# Patient Record
Sex: Female | Born: 1940 | Race: White | Hispanic: No | Marital: Married | Smoking: Never smoker
Health system: Southern US, Community
[De-identification: ages and names within clinical notes are randomized; demographics above are authoritative.]

## PROBLEM LIST (undated history)

## (undated) DIAGNOSIS — E039 Hypothyroidism, unspecified: Secondary | ICD-10-CM

## (undated) DIAGNOSIS — M1990 Unspecified osteoarthritis, unspecified site: Secondary | ICD-10-CM

## (undated) DIAGNOSIS — I4891 Unspecified atrial fibrillation: Secondary | ICD-10-CM

## (undated) HISTORY — PX: PANCREAS SURGERY: SHX731

## (undated) HISTORY — PX: ABDOMINAL HYSTERECTOMY: SHX81

## (undated) HISTORY — PX: CHOLECYSTECTOMY: SHX55

## (undated) HISTORY — DX: Unspecified osteoarthritis, unspecified site: M19.90

## (undated) HISTORY — DX: Hypothyroidism, unspecified: E03.9

## (undated) HISTORY — PX: APPENDECTOMY: SHX54

---

## 2015-09-28 ENCOUNTER — Emergency Department
Admission: EM | Admit: 2015-09-28 | Discharge: 2015-09-28 | Disposition: A | Discharge status: Home / Self Care | Payer: Medicare Other | Source: Home / Self Care

## 2015-09-28 DIAGNOSIS — Z111 Encounter for screening for respiratory tuberculosis: Secondary | ICD-10-CM | POA: Diagnosis not present

## 2015-09-28 MED ORDER — TUBERCULIN PPD 5 UNIT/0.1ML ID SOLN
5.0000 [IU] | Freq: Once | INTRADERMAL | Status: DC
  Administered 2015-09-28: 5 [IU] via INTRADERMAL

## 2015-09-28 NOTE — ED Notes (Signed)
PPD placement

## 2015-09-30 ENCOUNTER — Emergency Department
Admission: EM | Admit: 2015-09-30 | Discharge: 2015-09-30 | Disposition: A | Discharge status: Home / Self Care | Payer: Self-pay | Source: Home / Self Care

## 2015-10-21 ENCOUNTER — Encounter: Payer: Self-pay | Admitting: Osteopathic Medicine

## 2015-10-21 ENCOUNTER — Ambulatory Visit (INDEPENDENT_AMBULATORY_CARE_PROVIDER_SITE_OTHER): Payer: Medicare Other | Admitting: Osteopathic Medicine

## 2015-10-21 VITALS — BP 146/79 | HR 76 | Ht 63.0 in | Wt 113.0 lb

## 2015-10-21 DIAGNOSIS — M1990 Unspecified osteoarthritis, unspecified site: Secondary | ICD-10-CM

## 2015-10-21 DIAGNOSIS — Z79899 Other long term (current) drug therapy: Secondary | ICD-10-CM | POA: Diagnosis not present

## 2015-10-21 DIAGNOSIS — I4891 Unspecified atrial fibrillation: Secondary | ICD-10-CM

## 2015-10-21 DIAGNOSIS — E039 Hypothyroidism, unspecified: Secondary | ICD-10-CM

## 2015-10-21 DIAGNOSIS — Z139 Encounter for screening, unspecified: Secondary | ICD-10-CM

## 2015-10-21 DIAGNOSIS — Z13220 Encounter for screening for lipoid disorders: Secondary | ICD-10-CM

## 2015-10-21 DIAGNOSIS — Z780 Asymptomatic menopausal state: Secondary | ICD-10-CM

## 2015-10-21 NOTE — Progress Notes (Signed)
HPI: Sheral FlowDiane Lazare is a 74 y.o. female who [facility] for chief complaint of:  Chief Complaint  Patient presents with  . Establish Care    Last January had Thyroid checked.  Needs CBC, CMP and UA prior to moving into [place]. No additional complaints today   Past medical, social and family history reviewed: Past Medical History  Diagnosis Date  . Hypothyroidism 10/22/2015  . Osteoarthritis 10/22/2015   Past Surgical History  Procedure Laterality Date  . Cholecystectomy    . Appendectomy    . Abdominal hysterectomy    . Pancreas surgery     Social History  Substance Use Topics  . Smoking status: Never Smoker   . Smokeless tobacco: Not on file  . Alcohol Use: Not on file   No family history on file.  Current Outpatient Prescriptions  Medication Sig Dispense Refill  . celecoxib (CELEBREX) 200 MG capsule Take 200 mg by mouth 2 (two) times daily.    . flecainide (TAMBOCOR) 100 MG tablet Take 100 mg by mouth 2 (two) times daily.    Marland Kitchen. levothyroxine (SYNTHROID, LEVOTHROID) 125 MCG tablet Take 125 mcg by mouth daily before breakfast.     No current facility-administered medications for this visit.   No Known Allergies    Review of Systems: CONSTITUTIONAL:  No  fever, no chills, No  unintentional weight changes HEAD/EYES/EARS/NOSE/THROAT: No  headache, no vision change, no hearing change, No  sore throat, No  sinus pressure CARDIAC: No  chest pain, No  pressure, No palpitations, No  orthopnea RESPIRATORY: No  cough, No  shortness of breath/wheeze GASTROINTESTINAL: No  nausea, No  vomiting, No  abdominal pain, No  blood in stool, No  diarrhea, No  constipation  MUSCULOSKELETAL: No  myalgia/arthralgia GENITOURINARY: No  incontinence, No  abnormal genital bleeding/discharge SKIN: No  rash/wounds/concerning lesions HEM/ONC: No  easy bruising/bleeding, No  abnormal lymph node ENDOCRINE: No   polyuria/polydipsia/polyphagia, No  heat/cold intolerance  NEUROLOGIC: No  weakness, No  dizziness, No  slurred speech PSYCHIATRIC: No  concerns with depression, No  concerns with anxiety, No sleep problems     Exam:  BP 146/79 mmHg  Pulse 76  Ht 5\' 3"  (1.6 m)  Wt 113 lb (51.256 kg)  BMI 20.02 kg/m2 Constitutional: VS see above. General Appearance: alert, well-developed, well-nourished, NAD Eyes: Normal lids and conjunctive, non-icteric sclera, PERRLA Ears, Nose, Mouth, Throat: MMM, Normal external inspection ears/nares/mouth/lips/gums, TM normal, posterior pharynx No  erythema No  exudate Neck: No masses, trachea midline. No thyroid enlargement/tenderness/mass appreciated. No lymphadenopathy Respiratory: Normal respiratory effort. no wheeze, no rhonchi, no rales Cardiovascular: S1/S2 normal, no murmur, no rub/gallop auscultated. RRR. No lower extremity edema. Gastrointestinal: Nontender, no masses.  Musculoskeletal: Gait normal. No clubbing/cyanosis of digits.  Neurological: No cranial nerve deficit on limited exam. Motor and sensation intact and symmetric Skin: warm, dry, intact. No rash/ulcer. No concerning nevi or subq nodules on limited exam.   Psychiatric: Normal judgment/insight. Normal mood and affect. Oriented x3.    No results found for this or any previous visit (from the past 72 hour(s)).    ASSESSMENT/PLAN:  Screening - Plan: CBC with Differential/Platelet, COMPLETE METABOLIC PANEL WITH GFR, Urinalysis  Hypothyroidism, unspecified hypothyroidism type - Plan: TSH  Lipid screening - Plan: Lipid panel  Medication management - Plan: CBC with Differential/Platelet, COMPLETE METABOLIC PANEL WITH GFR, Lipid panel, Urinalysis  Atrial fibrillation, unspecified type (HCC) - Plan: CBC with Differential/Platelet, COMPLETE METABOLIC PANEL WITH  GFR, Lipid panel, Urinalysis. Patient reports history of atrial fibrillation, rate is regular on physical exam today, patient will  consider if she wants to reestablish with cardiology locally after her move here, we'll request records  Postmenopausal - Plan: Lipid panel  Osteoarthritis, unspecified osteoarthritis type, unspecified site   Patient reports up-to-date on all age appropriate vaccines, do not have records available. We'll need to get these.    FEMALE PREVENTIVE CARE  ANNUAL SCREENING/COUNSELING Tobacco - Never some exposure to secondhand smoke  Alcohol - glass of wine with dinner Diet/Exercise - HEALTHY HABITS DISCUSSED TO DECREASE CV RISK - walks a lot Sexual Health - Yes with female. STI - The patient denies history of sexually transmitted disease. INTERESTED IN STI TESTING - no Depression - PQH2 Negative Domestic violence concerns - no HTN SCREENING - SEE VITALS Vaccination status - SEE BELOW  INFECTIOUS DISEASE SCREENING HIV - all adults 15-65 - does not need GC/CT - sexually active - does not need HepC - born 54-1963 - does not need TB - if risk/required by employer - does not need  DISEASE SCREENING Lipid - (Low risk screen M35/F45; High risk screen M25/F35 if HTN, Tob, FH CHD M<55/F<65) - needs DM2 (45+ or Risk = FH 1st deg DM, Hx GDM, overweight/sedentary, high-risk ethnicity, HTN) - needs Osteoporosis - age 15+ or one sooner if risk - does not need  CANCER SCREENING Cervical - Pap q3 yr age 25+, Pap + HPV q5y age 55+ - PAP - does not need - hysterectomy due to heavy bleeding  Breast - Mammo age 8+ (C) and biennial age 9-75 (A) - MAMMO - does not need has had one earlier this year Lung - annual low dose CT Chest age 45-75 w/ 30+ PY, current/quit past 15 years - CT - does not need a history of smoking Colon - age 48+ or 72 years of age prior to FH Dx - GI REFERRAL - does not need - following with GI, hx polyp   ADULT VACCINATION Influenza - annual - already has  Td booster every 10 years - was not indicated since he already has this HPV - age <91yo - was not indicated Zoster - age  45+ - already has Pneumonia - age 15+ sooner if risk (DM, smoker, other) - already has  OTHER Fall - exercise and Vit D age 15+ - does not need Consider ASA - age 56-59 - does not need     No Follow-up on file.

## 2015-10-22 ENCOUNTER — Encounter: Payer: Self-pay | Admitting: Osteopathic Medicine

## 2015-10-22 DIAGNOSIS — E039 Hypothyroidism, unspecified: Secondary | ICD-10-CM

## 2015-10-22 DIAGNOSIS — M1990 Unspecified osteoarthritis, unspecified site: Secondary | ICD-10-CM

## 2015-10-22 HISTORY — DX: Hypothyroidism, unspecified: E03.9

## 2015-10-22 HISTORY — DX: Unspecified osteoarthritis, unspecified site: M19.90

## 2015-10-22 LAB — COMPLETE METABOLIC PANEL WITH GFR
ALBUMIN: 4.2 g/dL (ref 3.6–5.1)
ALK PHOS: 75 U/L (ref 33–130)
ALT: 10 U/L (ref 6–29)
AST: 14 U/L (ref 10–35)
BILIRUBIN TOTAL: 0.5 mg/dL (ref 0.2–1.2)
BUN: 12 mg/dL (ref 7–25)
CO2: 27 mmol/L (ref 20–31)
Calcium: 9.1 mg/dL (ref 8.6–10.4)
Chloride: 104 mmol/L (ref 98–110)
Creat: 0.7 mg/dL (ref 0.60–0.93)
GFR, EST NON AFRICAN AMERICAN: 87 mL/min (ref >=60)
GLUCOSE: 84 mg/dL (ref 65–99)
POTASSIUM: 4.1 mmol/L (ref 3.5–5.3)
SODIUM: 143 mmol/L (ref 135–146)
TOTAL PROTEIN: 6.6 g/dL (ref 6.1–8.1)

## 2015-10-22 LAB — CBC WITH DIFFERENTIAL/PLATELET
BASOS PCT: 0 % (ref 0–1)
Basophils Absolute: 0 10*3/uL (ref 0.0–0.1)
EOS ABS: 0.1 10*3/uL (ref 0.0–0.7)
Eosinophils Relative: 1 % (ref 0–5)
HCT: 35.3 % — ABNORMAL LOW (ref 36.0–46.0)
Hemoglobin: 12 g/dL (ref 12.0–15.0)
LYMPHS ABS: 3.4 10*3/uL (ref 0.7–4.0)
Lymphocytes Relative: 34 % (ref 12–46)
MCH: 30.6 pg (ref 26.0–34.0)
MCHC: 34 g/dL (ref 30.0–36.0)
MCV: 90.1 fL (ref 78.0–100.0)
MONO ABS: 0.7 10*3/uL (ref 0.1–1.0)
MONOS PCT: 7 % (ref 3–12)
MPV: 10.2 fL (ref 8.6–12.4)
Neutro Abs: 5.8 10*3/uL (ref 1.7–7.7)
Neutrophils Relative %: 58 % (ref 43–77)
PLATELETS: 298 10*3/uL (ref 150–400)
RBC: 3.92 MIL/uL (ref 3.87–5.11)
RDW: 13.9 % (ref 11.5–15.5)
WBC: 10 10*3/uL (ref 4.0–10.5)

## 2015-10-22 LAB — URINALYSIS
BILIRUBIN URINE: NEGATIVE
GLUCOSE, UA: NEGATIVE
Ketones, ur: NEGATIVE
Nitrite: NEGATIVE
PH: 5.5 (ref 5.0–8.0)
Protein, ur: NEGATIVE
Specific Gravity, Urine: 1.009 (ref 1.001–1.035)

## 2015-10-22 LAB — LIPID PANEL
CHOL/HDL RATIO: 1.6 ratio (ref <=5.0)
Cholesterol: 165 mg/dL (ref 125–200)
HDL: 103 mg/dL (ref >=46)
LDL Cholesterol: 49 mg/dL (ref <130)
TRIGLYCERIDES: 63 mg/dL (ref <150)
VLDL: 13 mg/dL (ref <30)

## 2015-10-22 LAB — TSH: TSH: 0.311 u[IU]/mL — AB (ref 0.350–4.500)

## 2015-10-22 MED ORDER — LEVOTHYROXINE SODIUM 100 MCG PO TABS: 100.0000 ug | ORAL_TABLET | Freq: Every day | ORAL | Status: DC

## 2015-11-13 ENCOUNTER — Telehealth: Payer: Self-pay

## 2015-11-13 DIAGNOSIS — E039 Hypothyroidism, unspecified: Secondary | ICD-10-CM

## 2015-11-13 MED ORDER — LEVOTHYROXINE SODIUM 100 MCG PO TABS: 100.0000 ug | ORAL_TABLET | Freq: Every day | ORAL | Status: DC

## 2015-11-13 NOTE — Telephone Encounter (Signed)
PATIENT REQUEST 90 DAY OF SYNTHROID. Marie Maldonado,CMA

## 2016-02-21 ENCOUNTER — Other Ambulatory Visit: Payer: Self-pay | Admitting: Osteopathic Medicine

## 2016-06-28 ENCOUNTER — Emergency Department (HOSPITAL_BASED_OUTPATIENT_CLINIC_OR_DEPARTMENT_OTHER)
Admission: EM | Admit: 2016-06-28 | Discharge: 2016-06-28 | Disposition: A | Discharge status: Home / Self Care | Payer: Medicare Other | Attending: Emergency Medicine | Admitting: Emergency Medicine

## 2016-06-28 ENCOUNTER — Emergency Department (HOSPITAL_BASED_OUTPATIENT_CLINIC_OR_DEPARTMENT_OTHER): Payer: Medicare Other

## 2016-06-28 ENCOUNTER — Emergency Department (HOSPITAL_COMMUNITY): Payer: Medicare Other

## 2016-06-28 ENCOUNTER — Encounter (HOSPITAL_BASED_OUTPATIENT_CLINIC_OR_DEPARTMENT_OTHER): Payer: Self-pay | Admitting: *Deleted

## 2016-06-28 DIAGNOSIS — M4726 Other spondylosis with radiculopathy, lumbar region: Secondary | ICD-10-CM | POA: Insufficient documentation

## 2016-06-28 DIAGNOSIS — M419 Scoliosis, unspecified: Secondary | ICD-10-CM | POA: Diagnosis not present

## 2016-06-28 DIAGNOSIS — E039 Hypothyroidism, unspecified: Secondary | ICD-10-CM | POA: Diagnosis not present

## 2016-06-28 DIAGNOSIS — M6281 Muscle weakness (generalized): Secondary | ICD-10-CM

## 2016-06-28 DIAGNOSIS — R531 Weakness: Secondary | ICD-10-CM

## 2016-06-28 DIAGNOSIS — M545 Low back pain: Secondary | ICD-10-CM

## 2016-06-28 DIAGNOSIS — M5116 Intervertebral disc disorders with radiculopathy, lumbar region: Secondary | ICD-10-CM | POA: Insufficient documentation

## 2016-06-28 DIAGNOSIS — Z79899 Other long term (current) drug therapy: Secondary | ICD-10-CM | POA: Insufficient documentation

## 2016-06-28 DIAGNOSIS — M4806 Spinal stenosis, lumbar region: Secondary | ICD-10-CM | POA: Diagnosis not present

## 2016-06-28 DIAGNOSIS — M5416 Radiculopathy, lumbar region: Secondary | ICD-10-CM

## 2016-06-28 DIAGNOSIS — M5136 Other intervertebral disc degeneration, lumbar region: Secondary | ICD-10-CM | POA: Diagnosis not present

## 2016-06-28 HISTORY — DX: Unspecified atrial fibrillation: I48.91

## 2016-06-28 MED ORDER — METHYLPREDNISOLONE 4 MG PO TBPK: ORAL_TABLET | ORAL | 0 refills | Status: DC

## 2016-06-28 MED ORDER — CYCLOBENZAPRINE HCL 10 MG PO TABS
10.0000 mg | ORAL_TABLET | Freq: Once | ORAL | Status: AC
  Administered 2016-06-28: 10 mg via ORAL
  Filled 2016-06-28: qty 1

## 2016-06-28 MED ORDER — NAPROXEN 500 MG PO TABS: 500.0000 mg | ORAL_TABLET | Freq: Two times a day (BID) | ORAL | 0 refills | Status: AC

## 2016-06-28 MED ORDER — KETOROLAC TROMETHAMINE 60 MG/2ML IM SOLN
30.0000 mg | Freq: Once | INTRAMUSCULAR | Status: AC
  Administered 2016-06-28: 30 mg via INTRAMUSCULAR
  Filled 2016-06-28: qty 2

## 2016-06-28 NOTE — ED Triage Notes (Signed)
C/o low back pain yesterday and slept on cough last pm and today back was hurting more. Now has back pain and hip pain. Arrived via [facility].

## 2016-06-28 NOTE — ED Notes (Signed)
Pt taken to xray prior to pain meds. Pt refused to move for xrays to be taken. Pt brought back to department and pain meds given will try xray again later.

## 2016-06-28 NOTE — ED Notes (Signed)
Due to pt's continued sx and inability to ambulate, will be [facility].

## 2016-06-28 NOTE — ED Notes (Signed)
Dr Clydene PughKnott on phone advising pt's daughter of dx and tx plan.

## 2016-06-28 NOTE — ED Notes (Signed)
Dr Clydene PughKnott aware pt has returned from MRI and results available.

## 2016-06-28 NOTE — ED Notes (Signed)
Pt returned from MRI. Lying on bed w/eyes closed. Respirations even, unlabored. Easily awakens to voice.

## 2016-06-28 NOTE — ED Notes (Addendum)
Dr Clydene PughKnott aware pt ambulated in hallway w/o difficulty. Pt states she feels stable to go home. Pt given Sprite - declined food.

## 2016-06-28 NOTE — ED Provider Notes (Signed)
[facility] Provider Note   CSN: [id number] Arrival date & time: 06/28/16  4098     History   Chief Complaint Chief Complaint  Patient presents with  . Back Pain    HPI Marie Maldonado is a 75 y.o. female.  The history is provided by the patient.  Back Pain   This is a recurrent problem. The current episode started 2 days ago. The problem occurs constantly. The problem has been rapidly worsening. The pain is associated with no known injury (states she was going through boxes yesterday and is very active but does not know of anything specific). The pain is present in the lumbar spine. The quality of the pain is described as aching, stabbing and shooting. The pain radiates to the left thigh and right thigh. The pain is at a severity of 10/10. The pain is severe. The symptoms are aggravated by bending, twisting and certain positions. The pain is the same all the time. Stiffness is present in the morning. Pertinent negatives include no chest pain, no fever, no numbness, no abdominal pain, no abdominal swelling, no bowel incontinence, no perianal numbness, no bladder incontinence, no dysuria, no pelvic pain, no leg pain, no paresthesias, no paresis and no weakness. She has tried nothing for the symptoms. The treatment provided no relief. Risk factors: states she had this one other time about 10 years ago but not this extreme.    Past Medical History:  Diagnosis Date  . Atrial fibrillation (HCC)   . Hypothyroidism 10/22/2015  . Osteoarthritis 10/22/2015    Patient Active Problem List   Diagnosis Date Noted  . Hypothyroidism 10/22/2015  . Osteoarthritis 10/22/2015    Past Surgical History:  Procedure Laterality Date  . ABDOMINAL HYSTERECTOMY    . APPENDECTOMY    . CHOLECYSTECTOMY    . PANCREAS SURGERY      OB History    No data available       Home Medications    Prior to Admission medications   Medication Sig Start Date End Date Taking? Authorizing Provider    aspirin EC 81 MG tablet Take 81 mg by mouth daily.   Yes Historical Provider, MD  celecoxib (CELEBREX) 200 MG capsule Take 200 mg by mouth 2 (two) times daily.   Yes Historical Provider, MD  diltiazem (CARDIZEM) 30 MG tablet Take 30 mg by mouth 4 (four) times daily.   Yes Historical Provider, MD  flecainide (TAMBOCOR) 100 MG tablet Take 100 mg by mouth 2 (two) times daily.   Yes Historical Provider, MD  fluticasone (FLONASE) 50 MCG/ACT nasal spray Place into both nostrils daily.   Yes Historical Provider, MD  levothyroxine (SYNTHROID, LEVOTHROID) 100 MCG tablet Take 1 tablet (100 mcg total) by mouth daily before breakfast. LABS NEEDED FOR FURTHER REFILLS 02/22/16  Yes Sunnie Nielsen, DO    Family History No family history on file.  Social History Social History  Substance Use Topics  . Smoking status: Never Smoker  . Smokeless tobacco: Never Used  . Alcohol use Not on file     Allergies   Review of patient's allergies indicates no known allergies.   Review of Systems Review of Systems  Constitutional: Negative for fever.  Cardiovascular: Negative for chest pain.  Gastrointestinal: Negative for abdominal pain and bowel incontinence.  Genitourinary: Negative for bladder incontinence, dysuria and pelvic pain.  Musculoskeletal: Positive for back pain.  Neurological: Negative for weakness, numbness and paresthesias.  All other systems reviewed and are negative.  Physical Exam Updated Vital Signs BP 138/87 (BP Location: Right Arm)   Pulse 86   Temp 98.8 F (37.1 C) (Oral)   Resp 16   Ht 5\' 3"  (1.6 m)   Wt 110 lb (49.9 kg)   SpO2 99%   BMI 19.49 kg/m   Physical Exam  Constitutional: She is oriented to person, place, and time. She appears well-developed and well-nourished. She appears distressed.  HENT:  Head: Normocephalic and atraumatic.  Mouth/Throat: Oropharynx is clear and moist.  Eyes: EOM are normal. Pupils are equal, round, and reactive to light.  Neck:  Normal range of motion. Neck supple. No spinous process tenderness and no muscular tenderness present. No neck rigidity. Normal range of motion present.  Cardiovascular: Normal rate, regular rhythm, normal heart sounds and intact distal pulses.   No murmur heard. Pulmonary/Chest: Effort normal and breath sounds normal. She has no wheezes. She has no rales.  Abdominal: Soft. She exhibits no distension. There is no tenderness. There is no CVA tenderness.  Musculoskeletal: She exhibits tenderness.       Right shoulder: She exhibits decreased range of motion, bony tenderness, pain and spasm. She exhibits normal pulse.       Lumbar back: She exhibits decreased range of motion and pain. She exhibits no swelling, no deformity and normal pulse.       Arms: Neurological: She is alert and oriented to person, place, and time. No sensory deficit. She exhibits normal muscle tone. Coordination normal.  Reflex Scores:      Patellar reflexes are 1+ on the right side and 1+ on the left side. 5/5 dorsi/plantar flexion of the feet bilaterally.  Pt is able to flex both knee independently but slowly and reproduces pain and stops.  Will not lift leg straight off the bed bilaterally most likely due to pain.  Difficult to assess strength.  Sensation intact over bilateral lower ext.  <3sec cap refill and 2+ DP pulses bilaterally.  Currently unable to ambulate due to pain  Skin: Skin is warm and dry. No rash noted.  Psychiatric: She has a normal mood and affect.  Nursing note and vitals reviewed.    ED Treatments / Results  Labs (all labs ordered are listed, but only abnormal results are displayed) Labs Reviewed - No data to display  EKG  EKG Interpretation None       Radiology Dg Lumbar Spine Complete  Result Date: 06/28/2016 CLINICAL DATA:  Muscle spasms in lower back since yesterday. Pain radiating to right side. EXAM: LUMBAR SPINE - COMPLETE 4+ VIEW COMPARISON:  None. FINDINGS: There is rightward  scoliosis in the mid lumbar spine. 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 No fracture. SI joints are symmetric and unremarkable. IMPRESSION: Scoliosis and advanced degenerative changes.  No acute findings. Electronically Signed   By: Charlett NoseKevin  Dover M.D.   On: 06/28/2016 11:17    Procedures Procedures (including critical care time)  Medications Ordered in ED Medications  ketorolac (TORADOL) injection 30 mg (not administered)  cyclobenzaprine (FLEXERIL) tablet 10 mg (not administered)     Initial Impression / Assessment and Plan / ED Course  I have reviewed the triage vital signs and the nursing notes.  Pertinent labs & imaging results that were available during my care of the patient were reviewed by me and considered in my medical decision making (see chart for details).  Clinical Course   Pt with gradual onset of back pain suggestive of radiculopathy.  No neurovascular compromise  and no incontinence however pt slow to move lower ext most likely related to pain.  Normal 5/5 strength on foot plantar and dorsiflexion.  Pt has no infectious sx, hx of CA  or other red flags concerning for pathologic back pain.  Denies trauma. Will give pt pain control and x-rays pending.  Will give toradol and flexeril and re-evaluate  11:43 AM Patient's pain is significantly improved. X-ray shows scoliosis and degenerative disc disease but no other acute findings such as compression fracture. Findings were discussed with the patient. We'll attempt to ambulate the patient.  2:33 PM After multiple attempts patient was unable to ambulate due to leg weakness. She has bilateral weakness in the quadriceps muscles. She is unable to lift her legs off the bed when she normally can walk 3-4 miles every day. Her pain is controlled at a 1 out of 10 and do not feel that the pain is the cause of her inability to move her legs. Will transfer to [facility] for MRI for further  evaluation.  Final Clinical Impressions(s) / ED Diagnoses   Final diagnoses:  Midline low back pain, with sciatica presence unspecified  Muscle weakness    New Prescriptions New Prescriptions   No medications on file     Gwyneth Sprout, MD 06/28/16 1434

## 2016-06-28 NOTE — ED Provider Notes (Signed)
Patient was seen and evaluated on arrival to the emergency department.  Pending MR for r/o cord involvement with severely limited mobility and back pain.   MR negative for cord pathology with question of nerve root impingement. Pt able to ambulate without pain here. Feels weak from not using legs but ambulating at baseline. Started on short course scheduled NSAIDs and steroid taper for supportive care. Plan to follow up with PCP as needed and return precautions discussed for worsening or new concerning symptoms.    Lyndal Pulleyaniel Demetris Capell, MD 06/28/16 [phone]

## 2016-06-28 NOTE — ED Notes (Signed)
Transported to MRI via stretcher.

## 2016-06-28 NOTE — ED Notes (Signed)
RN assisted pt changing into personal clothing. Pt standing at nurses' desk waiting for w/c so may be escorted to lobby while waiting for ride from [place]. Pt requesting to wait in lobby. Pt has all of her personal belongings - purse, cell phone, etc.

## 2016-06-28 NOTE — ED Notes (Signed)
Pt's daughter, Hanley HaysReghan, called - advised will call back in approx 1 hour for MRI results and tx plan - [phone].

## 2020-11-24 ENCOUNTER — Emergency Department (HOSPITAL_COMMUNITY): Payer: Medicare Other

## 2020-11-24 ENCOUNTER — Inpatient Hospital Stay (HOSPITAL_COMMUNITY)
Admission: EM | Admit: 2020-11-24 | Discharge: 2020-11-26 | DRG: 689 | Disposition: A | Discharge status: Skilled Nursing Facility | Payer: Medicare Other | Source: Skilled Nursing Facility | Attending: Family Medicine | Admitting: Family Medicine

## 2020-11-24 DIAGNOSIS — G250 Essential tremor: Secondary | ICD-10-CM | POA: Diagnosis present

## 2020-11-24 DIAGNOSIS — Z791 Long term (current) use of non-steroidal anti-inflammatories (NSAID): Secondary | ICD-10-CM

## 2020-11-24 DIAGNOSIS — E039 Hypothyroidism, unspecified: Secondary | ICD-10-CM | POA: Diagnosis present

## 2020-11-24 DIAGNOSIS — G9341 Metabolic encephalopathy: Secondary | ICD-10-CM | POA: Diagnosis present

## 2020-11-24 DIAGNOSIS — Z20822 Contact with and (suspected) exposure to covid-19: Secondary | ICD-10-CM | POA: Diagnosis present

## 2020-11-24 DIAGNOSIS — B9620 Unspecified Escherichia coli [E. coli] as the cause of diseases classified elsewhere: Secondary | ICD-10-CM | POA: Diagnosis present

## 2020-11-24 DIAGNOSIS — G9340 Encephalopathy, unspecified: Secondary | ICD-10-CM | POA: Diagnosis present

## 2020-11-24 DIAGNOSIS — E860 Dehydration: Secondary | ICD-10-CM | POA: Diagnosis present

## 2020-11-24 DIAGNOSIS — Z90710 Acquired absence of both cervix and uterus: Secondary | ICD-10-CM | POA: Diagnosis not present

## 2020-11-24 DIAGNOSIS — Z79890 Hormone replacement therapy (postmenopausal): Secondary | ICD-10-CM | POA: Diagnosis not present

## 2020-11-24 DIAGNOSIS — I480 Paroxysmal atrial fibrillation: Secondary | ICD-10-CM | POA: Diagnosis present

## 2020-11-24 DIAGNOSIS — Z7982 Long term (current) use of aspirin: Secondary | ICD-10-CM

## 2020-11-24 DIAGNOSIS — F32A Depression, unspecified: Secondary | ICD-10-CM | POA: Diagnosis present

## 2020-11-24 DIAGNOSIS — N390 Urinary tract infection, site not specified: Secondary | ICD-10-CM | POA: Diagnosis present

## 2020-11-24 DIAGNOSIS — F0390 Unspecified dementia without behavioral disturbance: Secondary | ICD-10-CM | POA: Diagnosis present

## 2020-11-24 DIAGNOSIS — Z79899 Other long term (current) drug therapy: Secondary | ICD-10-CM

## 2020-11-24 DIAGNOSIS — A419 Sepsis, unspecified organism: Secondary | ICD-10-CM

## 2020-11-24 LAB — URINALYSIS, ROUTINE W REFLEX MICROSCOPIC
Bilirubin Urine: NEGATIVE
Glucose, UA: NEGATIVE mg/dL
Ketones, ur: NEGATIVE mg/dL
Nitrite: POSITIVE — AB
Protein, ur: NEGATIVE mg/dL
Specific Gravity, Urine: 1.008 (ref 1.005–1.030)
pH: 6 (ref 5.0–8.0)

## 2020-11-24 LAB — CBC WITH DIFFERENTIAL/PLATELET
Abs Immature Granulocytes: 0.04 10*3/uL (ref 0.00–0.07)
Basophils Absolute: 0 10*3/uL (ref 0.0–0.1)
Basophils Relative: 0 %
Eosinophils Absolute: 0 10*3/uL (ref 0.0–0.5)
Eosinophils Relative: 0 %
HCT: 35.3 % — ABNORMAL LOW (ref 36.0–46.0)
Hemoglobin: 11.4 g/dL — ABNORMAL LOW (ref 12.0–15.0)
Immature Granulocytes: 0 %
Lymphocytes Relative: 19 %
Lymphs Abs: 2.9 10*3/uL (ref 0.7–4.0)
MCH: 30.2 pg (ref 26.0–34.0)
MCHC: 32.3 g/dL (ref 30.0–36.0)
MCV: 93.4 fL (ref 80.0–100.0)
Monocytes Absolute: 0.9 10*3/uL (ref 0.1–1.0)
Monocytes Relative: 6 %
Neutro Abs: 11 10*3/uL — ABNORMAL HIGH (ref 1.7–7.7)
Neutrophils Relative %: 75 %
Platelets: 300 10*3/uL (ref 150–400)
RBC: 3.78 MIL/uL — ABNORMAL LOW (ref 3.87–5.11)
RDW: 13 % (ref 11.5–15.5)
WBC: 14.8 10*3/uL — ABNORMAL HIGH (ref 4.0–10.5)
nRBC: 0 % (ref 0.0–0.2)

## 2020-11-24 LAB — COMPREHENSIVE METABOLIC PANEL
ALT: 15 U/L (ref 0–44)
AST: 19 U/L (ref 15–41)
Albumin: 3.8 g/dL (ref 3.5–5.0)
Alkaline Phosphatase: 81 U/L (ref 38–126)
Anion gap: 11 (ref 5–15)
BUN: 15 mg/dL (ref 8–23)
CO2: 25 mmol/L (ref 22–32)
Calcium: 9.1 mg/dL (ref 8.9–10.3)
Chloride: 103 mmol/L (ref 98–111)
Creatinine, Ser: 0.79 mg/dL (ref 0.44–1.00)
GFR, Estimated: >60 mL/min (ref >60)
Glucose, Bld: 126 mg/dL — ABNORMAL HIGH (ref 70–99)
Potassium: 3.7 mmol/L (ref 3.5–5.1)
Sodium: 139 mmol/L (ref 135–145)
Total Bilirubin: 0.7 mg/dL (ref 0.3–1.2)
Total Protein: 7.2 g/dL (ref 6.5–8.1)

## 2020-11-24 LAB — APTT: aPTT: 23 seconds — ABNORMAL LOW (ref 24–36)

## 2020-11-24 LAB — VALPROIC ACID LEVEL: Valproic Acid Lvl: <10 ug/mL — ABNORMAL LOW (ref 50.0–100.0)

## 2020-11-24 LAB — PROTIME-INR
INR: 1 (ref 0.8–1.2)
Prothrombin Time: 12.6 seconds (ref 11.4–15.2)

## 2020-11-24 LAB — LACTIC ACID, PLASMA: Lactic Acid, Venous: 1.5 mmol/L (ref 0.5–1.9)

## 2020-11-24 LAB — SARS CORONAVIRUS 2 BY RT PCR (HOSPITAL ORDER, PERFORMED IN ~~LOC~~ HOSPITAL LAB): SARS Coronavirus 2: NEGATIVE

## 2020-11-24 LAB — POC SARS CORONAVIRUS 2 AG -  ED: SARS Coronavirus 2 Ag: NEGATIVE

## 2020-11-24 MED ORDER — DIVALPROEX SODIUM ER 500 MG PO TB24
500.0000 mg | ORAL_TABLET | ORAL | Status: DC
  Administered 2020-11-25 – 2020-11-26 (×2): 500 mg via ORAL
  Filled 2020-11-24 (×3): qty 1

## 2020-11-24 MED ORDER — SODIUM CHLORIDE 0.9 % IV SOLN
2.0000 g | Freq: Once | INTRAVENOUS | Status: AC
  Administered 2020-11-24: 2 g via INTRAVENOUS
  Filled 2020-11-24: qty 20

## 2020-11-24 MED ORDER — VITAMIN D 25 MCG (1000 UNIT) PO TABS
2000.0000 [IU] | ORAL_TABLET | Freq: Every day | ORAL | Status: DC
  Administered 2020-11-26: 2000 [IU] via ORAL
  Filled 2020-11-24 (×2): qty 2

## 2020-11-24 MED ORDER — FLUTICASONE PROPIONATE 50 MCG/ACT NA SUSP
1.0000 | Freq: Every day | NASAL | Status: DC
  Administered 2020-11-24 – 2020-11-26 (×2): 1 via NASAL
  Filled 2020-11-24: qty 16

## 2020-11-24 MED ORDER — LACTATED RINGERS IV SOLN: INTRAVENOUS | Status: DC

## 2020-11-24 MED ORDER — LEVOTHYROXINE SODIUM 100 MCG PO TABS
100.0000 ug | ORAL_TABLET | Freq: Every day | ORAL | Status: DC
  Administered 2020-11-26: 100 ug via ORAL
  Filled 2020-11-24 (×2): qty 1

## 2020-11-24 MED ORDER — DILTIAZEM HCL 30 MG PO TABS
30.0000 mg | ORAL_TABLET | Freq: Two times a day (BID) | ORAL | Status: DC
  Administered 2020-11-25 – 2020-11-26 (×2): 30 mg via ORAL
  Filled 2020-11-24 (×4): qty 1

## 2020-11-24 MED ORDER — CALCIUM CARBONATE-VITAMIN D 500-200 MG-UNIT PO TABS
1.0000 | ORAL_TABLET | Freq: Every day | ORAL | Status: DC
  Administered 2020-11-26: 09:00:00 1 via ORAL
  Filled 2020-11-24 (×2): qty 1

## 2020-11-24 MED ORDER — ENOXAPARIN SODIUM 40 MG/0.4ML ~~LOC~~ SOLN
40.0000 mg | SUBCUTANEOUS | Status: DC
  Administered 2020-11-24 – 2020-11-25 (×2): 40 mg via SUBCUTANEOUS
  Filled 2020-11-24 (×2): qty 0.4

## 2020-11-24 MED ORDER — NORTRIPTYLINE HCL 25 MG PO CAPS
25.0000 mg | ORAL_CAPSULE | Freq: Every day | ORAL | Status: DC
  Administered 2020-11-25: 25 mg via ORAL
  Filled 2020-11-24 (×2): qty 1

## 2020-11-24 MED ORDER — ADULT MULTIVITAMIN LIQUID CH
15.0000 mL | Freq: Every day | ORAL | Status: DC
  Administered 2020-11-26: 15 mL via ORAL
  Filled 2020-11-24 (×2): qty 15

## 2020-11-24 MED ORDER — ACETAMINOPHEN 650 MG RE SUPP: 650.0000 mg | Freq: Four times a day (QID) | RECTAL | Status: DC | PRN

## 2020-11-24 MED ORDER — PROSIGHT PO TABS
1.0000 | ORAL_TABLET | Freq: Every day | ORAL | Status: DC
  Administered 2020-11-26: 1 via ORAL
  Filled 2020-11-24 (×2): qty 1

## 2020-11-24 MED ORDER — DEXTROSE IN LACTATED RINGERS 5 % IV SOLN: INTRAVENOUS | Status: DC

## 2020-11-24 MED ORDER — ACETAMINOPHEN 325 MG PO TABS
650.0000 mg | ORAL_TABLET | Freq: Four times a day (QID) | ORAL | Status: DC | PRN
  Administered 2020-11-24: 650 mg via ORAL

## 2020-11-24 MED ORDER — FLECAINIDE ACETATE 100 MG PO TABS
100.0000 mg | ORAL_TABLET | Freq: Two times a day (BID) | ORAL | Status: DC
  Administered 2020-11-25 – 2020-11-26 (×3): 100 mg via ORAL
  Filled 2020-11-24 (×4): qty 1

## 2020-11-24 MED ORDER — SODIUM CHLORIDE 0.9 % IV SOLN: 2.0000 g | Freq: Once | INTRAVENOUS | Status: DC

## 2020-11-24 MED ORDER — ASPIRIN EC 81 MG PO TBEC
81.0000 mg | DELAYED_RELEASE_TABLET | ORAL | Status: DC
  Administered 2020-11-26: 81 mg via ORAL
  Filled 2020-11-24 (×3): qty 1

## 2020-11-24 MED ORDER — ONDANSETRON HCL 4 MG/2ML IJ SOLN: 4.0000 mg | Freq: Four times a day (QID) | INTRAMUSCULAR | Status: DC | PRN

## 2020-11-24 MED ORDER — CELECOXIB 200 MG PO CAPS
200.0000 mg | ORAL_CAPSULE | Freq: Two times a day (BID) | ORAL | Status: DC
Start: 2020-11-24 — End: 2020-11-26
  Administered 2020-11-24 – 2020-11-26 (×4): 200 mg via ORAL
  Filled 2020-11-24 (×4): qty 1

## 2020-11-24 MED ORDER — ONDANSETRON HCL 4 MG PO TABS: 4.0000 mg | ORAL_TABLET | Freq: Four times a day (QID) | ORAL | Status: DC | PRN

## 2020-11-24 MED ORDER — THIAMINE HCL 100 MG/ML IJ SOLN
100.0000 mg | Freq: Every day | INTRAMUSCULAR | Status: DC
  Administered 2020-11-24 – 2020-11-26 (×3): 100 mg via INTRAVENOUS
  Filled 2020-11-24 (×3): qty 2

## 2020-11-24 MED ORDER — SODIUM CHLORIDE 0.9 % IV SOLN
1.0000 g | INTRAVENOUS | Status: DC
  Administered 2020-11-25 – 2020-11-26 (×2): 1 g via INTRAVENOUS
  Filled 2020-11-24 (×2): qty 1

## 2020-11-24 MED ORDER — ELDERTONIC PO LIQD
15.0000 mL | Freq: Every day | ORAL | Status: DC
  Filled 2020-11-24: qty 15

## 2020-11-24 NOTE — ED Notes (Addendum)
Attempted to call report to 4E, but primary RN requests this RN to call back in 5 minutes.

## 2020-11-24 NOTE — Progress Notes (Signed)
A consult was received from an ED physician for Cefepime per pharmacy dosing.  The patient's profile has been reviewed for ht/wt/allergies/indication/available labs.   A one time order has been placed for Cefepime 2g IV.  Further antibiotics/pharmacy consults should be ordered by admitting physician if indicated.                       Thank you, Lynann Beaver PharmD, BCPS Clinical Pharmacist [facility] main pharmacy [phone] 11/24/2020 2:10 PM

## 2020-11-24 NOTE — ED Notes (Signed)
Purewick inserted best as possible. Pt was unable to open her legs wide and understand directions.

## 2020-11-24 NOTE — Sepsis Progress Note (Signed)
Sepsis protocol being followed by elink 

## 2020-11-24 NOTE — Sepsis Progress Note (Signed)
Notified bedside nurse of need to administer antibiotics.  

## 2020-11-24 NOTE — ED Notes (Signed)
Pt mumbling in response to questions, would not squeeze hand when asked. Pupils equal, round, reactive.

## 2020-11-24 NOTE — ED Notes (Signed)
ED TO INPATIENT HANDOFF REPORT  Name/Age/Gender Marie Maldonado 80 y.o. female  Code Status    Code Status Orders  (From admission, onward)         Start     Ordered   11/24/20 1837  Full code  Continuous        11/24/20 1836        Code Status History    This patient has a current code status but no historical code status.   Advance Care Planning Activity    Advance Directive Documentation   Flowsheet Row Most Recent Value  Type of Advance Directive Healthcare Power of Attorney, Living will  Pre-existing out of facility DNR order (yellow form or pink MOST form) --  "MOST" Form in Place? --      Home/SNF/Other Nursing Home  Chief Complaint Sepsis ([place]) [A41.9]  Level of Care/Admitting Diagnosis ED Disposition    ED Disposition Condition [place]: [facility] [[id number]]  Level of Care: Telemetry [5]  Admit to tele based on following criteria: Other see comments  Comments: sepsis  May admit patient to [facility] or [facility] if equivalent level of care is available:: Yes  Covid Evaluation: Confirmed COVID Negative  Diagnosis: Sepsis [facility]) [[id number]]  Admitting Physician: Tawni Millers [[id number]]  Attending Physician: Tawni Millers [[id number]]  Estimated length of stay: 3 - 4 days  Certification:: I certify this patient will need inpatient services for at least 2 midnights       Medical History Past Medical History:  Diagnosis Date  . Atrial fibrillation ([place])   . Hypothyroidism 10/22/2015  . Osteoarthritis 10/22/2015    Allergies Allergies  Allergen Reactions  . Beta Adrenergic Blockers     Other reaction(s): Other (See Comments) Pt states they make her feel "disconnected."    IV Location/Drains/Wounds Patient Lines/Drains/Airways Status    Active Line/Drains/Airways    Name Placement date Placement time Site Days   Peripheral IV 11/24/20 Anterior;Left Forearm 11/24/20  --  Forearm  less  than 1   Peripheral IV 11/24/20 Left Antecubital 11/24/20  1222  Antecubital  less than 1   External Urinary Catheter 11/24/20  1141  --  less than 1          Labs/Imaging Results for orders placed or performed during the hospital encounter of 11/24/20 (from the past 48 hour(s))  POC SARS Coronavirus 2 Ag-ED - Nasal Swab (BD Veritor Kit)     Status: None   Collection Time: 11/24/20 12:23 PM  Result Value Ref Range   SARS Coronavirus 2 Ag NEGATIVE NEGATIVE    Comment: (NOTE) SARS-CoV-2 antigen NOT DETECTED.   Negative results are presumptive.  Negative results do not preclude SARS-CoV-2 infection and should not be used as the sole basis for treatment or other patient management decisions, including infection  control decisions, particularly in the presence of clinical signs and  symptoms consistent with COVID-19, or in those who have been in contact with the virus.  Negative results must be combined with clinical observations, patient history, and epidemiological information. The expected result is Negative.  Fact Sheet for Patients: HandmadeRecipes.com.cy  Fact Sheet for Healthcare Providers: FuneralLife.at  This test is not yet approved or cleared by the [place] FDA and  has been authorized for detection and/or diagnosis of SARS-CoV-2 by FDA under an Emergency Use Authorization (EUA).  This EUA will remain in effect (meaning this test can be used) for the duration of  the COV ID-19 declaration under Section 564(b)(1) of the Act, 21 U.S.C. section 360bbb-3(b)(1), unless the authorization is terminated or revoked sooner.    Lactic acid, plasma     Status: None   Collection Time: 11/24/20 12:33 PM  Result Value Ref Range   Lactic Acid, Venous 1.5 0.5 - 1.9 mmol/L    Comment: Performed at [facility], [place] [address], [place], [place] [address]  Comprehensive metabolic panel     Status: Abnormal    Collection Time: 11/24/20 12:33 PM  Result Value Ref Range   Sodium 139 135 - 145 mmol/L   Potassium 3.7 3.5 - 5.1 mmol/L   Chloride 103 98 - 111 mmol/L   CO2 25 22 - 32 mmol/L   Glucose, Bld 126 (H) 70 - 99 mg/dL    Comment: Glucose reference range applies only to samples taken after fasting for at least 8 hours.   BUN 15 8 - 23 mg/dL   Creatinine, Ser 0.79 0.44 - 1.00 mg/dL   Calcium 9.1 8.9 - 10.3 mg/dL   Total Protein 7.2 6.5 - 8.1 g/dL   Albumin 3.8 3.5 - 5.0 g/dL   AST 19 15 - 41 U/L   ALT 15 0 - 44 U/L   Alkaline Phosphatase 81 38 - 126 U/L   Total Bilirubin 0.7 0.3 - 1.2 mg/dL   GFR, Estimated >60 >60 mL/min    Comment: (NOTE) Calculated using the CKD-EPI Creatinine Equation (2021)    Anion gap 11 5 - 15    Comment: Performed at [facility], [place] [address], [place], [place] [address]  CBC WITH DIFFERENTIAL     Status: Abnormal   Collection Time: 11/24/20 12:33 PM  Result Value Ref Range   WBC 14.8 (H) 4.0 - 10.5 K/uL   RBC 3.78 (L) 3.87 - 5.11 MIL/uL   Hemoglobin 11.4 (L) 12.0 - 15.0 g/dL   HCT 35.3 (L) 36.0 - 46.0 %   MCV 93.4 80.0 - 100.0 fL   MCH 30.2 26.0 - 34.0 pg   MCHC 32.3 30.0 - 36.0 g/dL   RDW 13.0 11.5 - 15.5 %   Platelets 300 150 - 400 K/uL   nRBC 0.0 0.0 - 0.2 %   Neutrophils Relative % 75 %   Neutro Abs 11.0 (H) 1.7 - 7.7 K/uL   Lymphocytes Relative 19 %   Lymphs Abs 2.9 0.7 - 4.0 K/uL   Monocytes Relative 6 %   Monocytes Absolute 0.9 0.1 - 1.0 K/uL   Eosinophils Relative 0 %   Eosinophils Absolute 0.0 0.0 - 0.5 K/uL   Basophils Relative 0 %   Basophils Absolute 0.0 0.0 - 0.1 K/uL   Immature Granulocytes 0 %   Abs Immature Granulocytes 0.04 0.00 - 0.07 K/uL    Comment: Performed at [facility], [place] [address], [place], [place] [address]  Protime-INR     Status: None   Collection Time: 11/24/20 12:33 PM  Result Value Ref Range   Prothrombin Time 12.6 11.4 - 15.2 seconds   INR 1.0 0.8 - 1.2    Comment:  (NOTE) INR goal varies based on device and disease states. Performed at [facility], [place] [address], [place],  [address]   APTT     Status: Abnormal   Collection Time: 11/24/20 12:33 PM  Result Value Ref Range   aPTT 23 (L) 24 - 36 seconds    Comment: Performed at [facility], [place]  [address], [place], [place] [address]  Valproic acid level     Status: Abnormal   Collection Time: 11/24/20 12:33 PM  Result Value Ref Range   Valproic Acid Lvl <10 (L) 50.0 - 100.0 ug/mL    Comment: Performed at [facility], [place] [address], [place], [place] [address]  SARS Coronavirus 2 by RT PCR (hospital order, performed in [facility] hospital lab) Nasopharyngeal Nasopharyngeal Swab     Status: None   Collection Time: 11/24/20 12:50 PM   Specimen: Nasopharyngeal Swab  Result Value Ref Range   SARS Coronavirus 2 NEGATIVE NEGATIVE    Comment: (NOTE) SARS-CoV-2 target nucleic acids are NOT DETECTED.  The SARS-CoV-2 RNA is generally detectable in upper and lower respiratory specimens during the acute phase of infection. The lowest concentration of SARS-CoV-2 viral copies this assay can detect is 250 copies / mL. A negative result does not preclude SARS-CoV-2 infection and should not be used as the sole basis for treatment or other patient management decisions.  A negative result may occur with improper specimen collection / handling, submission of specimen other than nasopharyngeal swab, presence of viral mutation(s) within the areas targeted by this assay, and inadequate number of viral copies (<250 copies / mL). A negative result must be combined with clinical observations, patient history, and epidemiological information.  Fact Sheet for Patients:   StrictlyIdeas.no  Fact Sheet for Healthcare Providers: BankingDealers.co.za  This test is not yet approved or  cleared by the [place] FDA and has been authorized for detection and/or diagnosis of SARS-CoV-2 by FDA under an Emergency Use Authorization (EUA).  This EUA will remain in effect (meaning this test can be used) for the duration of the COVID-19 declaration under Section 564(b)(1) of the Act, 21 U.S.C. section 360bbb-3(b)(1), unless the authorization is terminated or revoked sooner.  Performed at [facility], [place] [address], [place], [place] [address]   Urinalysis, Routine w reflex microscopic Urine, Catheterized     Status: Abnormal   Collection Time: 11/24/20  3:00 PM  Result Value Ref Range   Color, Urine YELLOW YELLOW   APPearance CLEAR CLEAR   Specific Gravity, Urine 1.008 1.005 - 1.030   pH 6.0 5.0 - 8.0   Glucose, UA NEGATIVE NEGATIVE mg/dL   Hgb urine dipstick MODERATE (A) NEGATIVE   Bilirubin Urine NEGATIVE NEGATIVE   Ketones, ur NEGATIVE NEGATIVE mg/dL   Protein, ur NEGATIVE NEGATIVE mg/dL   Nitrite POSITIVE (A) NEGATIVE   Leukocytes,Ua TRACE (A) NEGATIVE   RBC / HPF 0-5 0 - 5 RBC/hpf   WBC, UA 0-5 0 - 5 WBC/hpf   Bacteria, UA RARE (A) NONE SEEN   Mucus PRESENT     Comment: Performed at [facility], [place] [address], [place], [place] [address]   CT Head Wo Contrast  Result Date: 11/24/2020 CLINICAL DATA:  Mental status change. EXAM: CT HEAD WITHOUT CONTRAST TECHNIQUE: Contiguous axial images were obtained from the base of the skull through the vertex without intravenous contrast. COMPARISON:  None. FINDINGS: Brain: No evidence of acute large vascular territory infarction, hemorrhage, hydrocephalus, extra-axial collection or mass lesion/mass effect. Patchy white matter hypoattenuation, most likely related to chronic microvascular ischemic disease. 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 Generalized cerebral atrophy with ex vacuo ventricular dilation. Vascular: No hyperdense vessel identified. Calcific atherosclerosis. Skull: No  acute fracture. Sinuses/Orbits: Ethmoid air cell mucosal thickening without air-fluid levels. Unremarkable orbits. Other: No mastoid effusions. IMPRESSION: 1. No  specific evidence of acute intracranial abnormality. 2. 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 An MRI could better evaluate for acute ischemia if there is clinical concern. 3. Generalized cerebral atrophy. Electronically Signed   By: Margaretha Sheffield MD   On: 11/24/2020 12:56   DG Pelvis Portable  Result Date: 11/24/2020 CLINICAL DATA:  Unwitnessed fall, altered mental status EXAM: PORTABLE PELVIS 1-2 VIEWS COMPARISON:  None. FINDINGS: No pelvic fracture or diastasis. Degenerative changes in the visualized lower lumbar spine. No evidence of hip dislocation on this single frontal view. No significant hip arthropathy. No suspicious focal osseous lesions. IMPRESSION: No pelvic fracture. Electronically Signed   By: Ilona Sorrel M.D.   On: 11/24/2020 12:46   DG Chest Port 1 View  Result Date: 11/24/2020 CLINICAL DATA:  Atrial fibrillation, altered mental status, fall EXAM: PORTABLE CHEST 1 VIEW COMPARISON:  None. FINDINGS: Normal heart size. Atherosclerotic aortic arch. Otherwise normal mediastinal contour. No pneumothorax. No pleural effusion. No pulmonary edema. Mild left basilar atelectasis. IMPRESSION: Mild left basilar atelectasis. Otherwise no active cardiopulmonary disease. Electronically Signed   By: Ilona Sorrel M.D.   On: 11/24/2020 12:43    Pending Labs Unresulted Labs (From admission, onward)          Start     Ordered   11/24/20 1133  Urine culture  (Undifferentiated presentation (screening labs and basic nursing orders))  ONCE - STAT,   STAT        11/24/20 1133   11/24/20 1133  Blood Culture (routine x 2)  (Undifferentiated presentation (screening labs and basic nursing orders))  BLOOD CULTURE X 2,   STAT      11/24/20 1133   Signed and Held  CBC   (enoxaparin (LOVENOX)    CrCl >/= 30 ml/min)  Once,   R       Comments: Baseline for enoxaparin therapy IF NOT ALREADY DRAWN.  Notify MD if PLT < 100 K.    Signed and Held   Signed and Held  Creatinine, serum  (enoxaparin (LOVENOX)    CrCl >/= 30 ml/min)  Once,   R       Comments: Baseline for enoxaparin therapy IF NOT ALREADY DRAWN.    Signed and Held   Signed and Held  Creatinine, serum  (enoxaparin (LOVENOX)    CrCl >/= 30 ml/min)  Weekly,   R     Comments: while on enoxaparin therapy    Signed and Held   Signed and Held  Basic metabolic panel  Tomorrow morning,   R        Signed and Held   Signed and Held  CBC  Tomorrow morning,   R        Signed and Held          Vitals/Pain Today's Vitals   11/24/20 1637 11/24/20 1641 11/24/20 1756 11/24/20 1800  BP: (!) 102/45 (!) 102/45  (!) 96/47  Pulse: 71 72  71  Resp:  18  (!) 21  Temp:      TempSrc:      SpO2: 94% 94%  92%  Weight:   59 kg   Height:   5' 3"  (1.6 m)   PainSc:        Isolation Precautions No active isolations  Medications Medications  lactated ringers infusion ( Intravenous New Bag/Given 11/24/20 1251)  cefTRIAXone (ROCEPHIN) 2 g in sodium chloride 0.9 % 100 mL IVPB (0 g Intravenous Stopped 11/24/20  1500)    Mobility non-ambulatory

## 2020-11-24 NOTE — H&P (Signed)
History and Physical    Marie Maldonado GDJ:[id number] DOB: 1941/08/17 DOA: 11/24/2020  PCP: Nadara Eaton, MD   Patient coming from: Home   Chief Complaint: Altered mental status.   HPI: Marie Maldonado is a 80 y.o. female with medical history significant of atrial fibrillation and hypothyroidism.  She is a resident from assisted living.  Apparently her husband died about 2 to 3 weeks ago, since then she had a rapid decline in cognitive function.  Care givers noted a change in her mentation, with confusion and hallucinations.  Today when she was helped to sit up she was leaning towards the left side, due to her change in mentation she was transferred to the hospital.  Patient is unable to give detailed history due to her cognitive impairment, I called her daughter Ms. Hyacinth Meeker, [phone], unable to reach her, I left a message.  ED Course: Patient was found dehydrated, systolic blood pressure 99, altered and confused.  Not at her baseline.  She was diagnosed with urinary tract infection with features of sepsis, received IV fluids, IV ceftriaxone and referred for further hospitalization.  Review of Systems: Unable to obtain due to cognitive impairment.   Past Medical History:  Diagnosis Date  . Atrial fibrillation (HCC)   . Hypothyroidism 10/22/2015  . Osteoarthritis 10/22/2015    Past Surgical History:  Procedure Laterality Date  . ABDOMINAL HYSTERECTOMY    . APPENDECTOMY    . CHOLECYSTECTOMY    . PANCREAS SURGERY       reports that she has never smoked. She has never used smokeless tobacco. No history on file for alcohol use and drug use.  Allergies  Allergen Reactions  . Beta Adrenergic Blockers     Other reaction(s): Other (See Comments) Pt states they make her feel "disconnected."    No family history on file.   Prior to Admission medications   Medication Sig Start Date End Date Taking? Authorizing Provider  acetaminophen (TYLENOL) 500 MG tablet Take 500 mg by mouth  every morning.    [provider]  aspirin EC 81 MG tablet Take 81 mg by mouth every morning.     [provider]  beta carotene w/minerals (OCUVITE) tablet Take 1 tablet by mouth daily at 12 noon.    [provider]  Calcium Carb-Ergocalciferol 250-125 MG-UNIT TABS Take 1 tablet by mouth daily at 12 noon.    [provider]  celecoxib (CELEBREX) 200 MG capsule Take 200 mg by mouth 2 (two) times daily.    [provider]  Cholecalciferol (VITAMIN D3) 2000 units capsule Take 2,000 Units by mouth daily at 12 noon.    [provider]  Cyanocobalamin (RA VITAMIN B-12 TR) 1000 MCG TBCR Take 1,000 mcg by mouth daily.    [provider]  diltiazem (CARDIZEM) 30 MG tablet Take 30 mg by mouth See admin instructions. 30 mg twice a day.  May take an additional 30 mg if needed for palpitations    [provider]  divalproex (DEPAKOTE ER) 500 MG 24 hr tablet Take 500 mg by mouth every morning. 06/26/16   [provider]  flecainide (TAMBOCOR) 100 MG tablet Take 100 mg by mouth See admin instructions. 100 mg twice a day.  May take an additional 100 mg for arrhythmias    [provider]  fluticasone (FLONASE) 50 MCG/ACT nasal spray Place 1 spray into both nostrils daily.     [provider]  Lactobacillus Rhamnosus, GG, (CULTURELLE PO) Take 1 tablet  by mouth daily as needed (for digestive health).     [provider]  levothyroxine (SYNTHROID, LEVOTHROID) 100 MCG tablet Take 1 tablet (100 mcg total) by mouth daily before breakfast. LABS NEEDED FOR FURTHER REFILLS 02/22/16   Sunnie Nielsen, DO  methylPREDNISolone (MEDROL DOSEPAK) 4 MG TBPK tablet Follow package instructions 06/28/16   Lyndal Pulley, MD  nortriptyline (PAMELOR) 25 MG capsule Take 25 mg by mouth at bedtime.  06/26/16   [provider]    Physical Exam: Vitals:   11/24/20 1600 11/24/20 1630 11/24/20 1637 11/24/20 1641  BP: (!) 103/43 (!)  99/55 (!) 102/45 (!) 102/45  Pulse: 76 75 71 72  Resp: 13   18  Temp:      TempSrc:      SpO2: 98% 98% 94% 94%    Vitals:   11/24/20 1600 11/24/20 1630 11/24/20 1637 11/24/20 1641  BP: (!) 103/43 (!) 99/55 (!) 102/45 (!) 102/45  Pulse: 76 75 71 72  Resp: 13   18  Temp:      TempSrc:      SpO2: 98% 98% 94% 94%   General: deconditioned and ill looking appearing  Neurology: somnolent, responds to her name, but not able to follow commands, she has rigidity in her upper extremities. Keeps her eyes closed.  Head and Neck. Head normocephalic. Neck supple with no adenopathy or thyromegaly.   E ENT: positive pallor, no icterus, oral mucosa moist Cardiovascular: No JVD. S1-S2 present, rhythmic, no gallops, rubs, or murmurs. Trace non pitting lower extremity edema. Pulmonary: positive breath sounds bilaterally,with no wheezing, rhonchi or rales. Gastrointestinal. Abdomen soft and non tender Skin. No rashes Musculoskeletal: no joint deformities    Labs on Admission: I have personally reviewed following labs and imaging studies  CBC: Recent Labs  Lab 11/24/20 1233  WBC 14.8*  NEUTROABS 11.0*  HGB 11.4*  HCT 35.3*  MCV 93.4  PLT 300   Basic Metabolic Panel: Recent Labs  Lab 11/24/20 1233  NA 139  K 3.7  CL 103  CO2 25  GLUCOSE 126*  BUN 15  CREATININE 0.79  CALCIUM 9.1   GFR: CrCl cannot be calculated (Unknown ideal weight.). Liver Function Tests: Recent Labs  Lab 11/24/20 1233  AST 19  ALT 15  ALKPHOS 81  BILITOT 0.7  PROT 7.2  ALBUMIN 3.8   No results for input(s): LIPASE, AMYLASE in the last 168 hours. No results for input(s): AMMONIA in the last 168 hours. Coagulation Profile: Recent Labs  Lab 11/24/20 1233  INR 1.0   Cardiac Enzymes: No results for input(s): CKTOTAL, CKMB, CKMBINDEX, TROPONINI in the last 168 hours. BNP (last 3 results) No results for input(s): PROBNP in the last 8760 hours. HbA1C: No results for input(s): HGBA1C in the last 72  hours. CBG: No results for input(s): GLUCAP in the last 168 hours. Lipid Profile: No results for input(s): CHOL, HDL, LDLCALC, TRIG, CHOLHDL, LDLDIRECT in the last 72 hours. Thyroid Function Tests: No results for input(s): TSH, T4TOTAL, FREET4, T3FREE, THYROIDAB in the last 72 hours. Anemia Panel: No results for input(s): VITAMINB12, FOLATE, FERRITIN, TIBC, IRON, RETICCTPCT in the last 72 hours. Urine analysis:    Component Value Date/Time   COLORURINE YELLOW 11/24/2020 1500   APPEARANCEUR CLEAR 11/24/2020 1500   LABSPEC 1.008 11/24/2020 1500   PHURINE 6.0 11/24/2020 1500   GLUCOSEU NEGATIVE 11/24/2020 1500   HGBUR MODERATE (A) 11/24/2020 1500   BILIRUBINUR NEGATIVE 11/24/2020 1500   KETONESUR NEGATIVE 11/24/2020 1500   PROTEINUR  NEGATIVE 11/24/2020 1500   NITRITE POSITIVE (A) 11/24/2020 1500   LEUKOCYTESUR TRACE (A) 11/24/2020 1500    Radiological Exams on Admission: CT Head Wo Contrast  Result Date: 11/24/2020 CLINICAL DATA:  Mental status change. EXAM: CT HEAD WITHOUT CONTRAST TECHNIQUE: Contiguous axial images were obtained from the base of the skull through the vertex without intravenous contrast. COMPARISON:  None. FINDINGS: Brain: No evidence of acute large vascular territory infarction, hemorrhage, hydrocephalus, extra-axial collection or mass lesion/mass effect. Patchy white matter hypoattenuation, most likely related to chronic microvascular ischemic disease. 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 Generalized cerebral atrophy with ex vacuo ventricular dilation. Vascular: No hyperdense vessel identified. Calcific atherosclerosis. Skull: No acute fracture. Sinuses/Orbits: Ethmoid air cell mucosal thickening without air-fluid levels. Unremarkable orbits. Other: No mastoid effusions. IMPRESSION: 1. No specific evidence of acute intracranial abnormality. 2. Chronic microvascular ischemic disease with remote appearing small perforator infarct in the left  corona radiata. An MRI could better evaluate for acute ischemia if there is clinical concern. 3. Generalized cerebral atrophy. Electronically Signed   By: Feliberto Harts MD   On: 11/24/2020 12:56   DG Pelvis Portable  Result Date: 11/24/2020 CLINICAL DATA:  Unwitnessed fall, altered mental status EXAM: PORTABLE PELVIS 1-2 VIEWS COMPARISON:  None. FINDINGS: No pelvic fracture or diastasis. Degenerative changes in the visualized lower lumbar spine. No evidence of hip dislocation on this single frontal view. No significant hip arthropathy. No suspicious focal osseous lesions. IMPRESSION: No pelvic fracture. Electronically Signed   By: Delbert Phenix M.D.   On: 11/24/2020 12:46   DG Chest Port 1 View  Result Date: 11/24/2020 CLINICAL DATA:  Atrial fibrillation, altered mental status, fall EXAM: PORTABLE CHEST 1 VIEW COMPARISON:  None. FINDINGS: Normal heart size. Atherosclerotic aortic arch. Otherwise normal mediastinal contour. No pneumothorax. No pleural effusion. No pulmonary edema. Mild left basilar atelectasis. IMPRESSION: Mild left basilar atelectasis. Otherwise no active cardiopulmonary disease. Electronically Signed   By: Delbert Phenix M.D.   On: 11/24/2020 12:43    EKG: Independently reviewed.  76 bpm, normal axis, normal intervals, sinus rhythm, no ST segment T wave changes, noisy baseline.  Assessment/Plan Principal Problem:   Acute metabolic encephalopathy Active Problems:   Hypothyroidism   Depression   AF (paroxysmal atrial fibrillation) (HCC)   80 year old female who comes from assisted living facility.  She had a rapid decline in her cognitive impairment for the last 2-3 weeks, worsening over the last 24 hours.  Hallucinations and confusion.  Unable to keep further history due to her cognitive impairment.  Unable to reach her family. On her initial physical examination blood pressure 96/47, 102/45, heart rate 71, respiratory rate 21-23, oxygenation 96% on room air, she has dry mucous  membranes, lungs are clear to auscultation, heart S1-S2, present, abdomen soft, trace lower extremity edema.  Neurologically patient has rigidity upper extremities, not following commands, keeps her eyes closed, responding only to her name.   Sodium 139, potassium 3.7, chloride 103, bicarb 25, glucose 126, BUN 15, creatinine 0.79, white count 14.8, hemoglobin 9.4, hematocrit 35.3, platelets 300. SARS COVID-19 negative. Urinalysis specific gravity 1.008, 0-5 red cells, 0-5 white cells.  Positive nitrates, trace leukocytes.  CT head with no acute changes, chronic microvascular ischemic disease with remote appearing small peripheral infarct in the left corona radiata.  Generalized cerebral atrophy. Chest radiograph no infiltrates.  Ms. Schobert will be admitted to the hospital with a working diagnosis of acute metabolic encephalopathy, rule out urinary tract infection.  1.  Acute metabolic encephalopathy.  Clearly patient showing a change in her baseline.  On physical examination she has no focality but rigidity of the upper extremities.  Continue supportive medical care with intravenous fluids, neurochecks, and aspiration precautions.  Per ED report her daughter had mentioned change in mentation when patient developes urinary tract infections.  Because of her cognitive impairment it is difficult to assess for any urinary symptoms.   For now patient will be continued on ceftriaxone.  At this point cannot rule out infection. (clinically rule out for sepsis)  Follow-up with urine culture.   Add multivitamins and IV thiamine.  Consult speech, PT, OT.  2.  Atrial fibrillation.  Continue rate control with diltiazem and rhythm control with flecainide. Not on anticoagulation. No clinical signs of heart failure.   3.  Hypothyroid.  Continue levothyroxine.  4.  Depression.  Continue nortriptyline and divalproex.     Status is: Inpatient  Remains inpatient appropriate because:IV treatments  appropriate due to intensity of illness or inability to take PO   Dispo: The patient is from: ALF              Anticipated d/c is to: ALF              Anticipated d/c date is: 3 days              Patient currently is not medically stable to d/c.   Difficult to place patient No   DVT prophylaxis: Enoxaparin   Code Status:   full  Family Communication:  Not able to reach her daughter over the phone     Consults called:  None Admission status:   inpatient     Burnette Valenti Annett Gula MD [facility]   11/24/2020, 5:03 PM

## 2020-11-24 NOTE — ED Triage Notes (Signed)
Pt BIB [facility] from facility. Hx dementia, at baseline speaks plainly and ambulates independently. Pt found this morning in living area asleep on couch, clothing wet with urine. Required wheelchair to return to room. Very lethargic since. Husband passed away last week at same facility. Alert to self. Pt's nurse states she has been c/o pain more than usual esp in hips. EMS notes no unusual odor to urine. 20ga LF.  BP 142/88 HR 90 RR 16 SpO2 98% RA Temp 98.7 CBG 120

## 2020-11-24 NOTE — ED Notes (Signed)
Daughter, [name], requests updates when available via her cell phone [phone]

## 2020-11-24 NOTE — ED Provider Notes (Addendum)
[facility]-EMERGENCY DEPT Provider Note   CSN: [id number] Arrival date & time: 11/24/20  1052     History Chief Complaint  Patient presents with  . Altered Mental Status    Consepcion Maldonado is a 80 y.o. female.  HPI     80 year old female with history of A. fib comes in a chief complaint of altered mental status. Level 5 caveat for altered mental status.  I spoke with Ms. Marie Maldonado at [facility] assisted living facility.  She informs me that patient's husband passed away 2 or 3 weeks ago.  She has noticed cognitive decline in the patient since then.  At baseline patient is able to hold a conversation and is able to ambulate on her own.  More recently she has been confused and often also noted to be hallucinating.  She was last known to be ambulatory last night.  This morning she was found laying in her couch and had wet herself.  Patient told the staff to leave her alone, when they went to check back on her she was still in the couch.  At that time they tried to sit her up and walker but noted that she was leaning more towards the left side.  Past Medical History:  Diagnosis Date  . Atrial fibrillation (HCC)   . Hypothyroidism 10/22/2015  . Osteoarthritis 10/22/2015    Patient Active Problem List   Diagnosis Date Noted  . Sepsis (HCC) 11/24/2020  . Hypothyroidism 10/22/2015  . Osteoarthritis 10/22/2015    Past Surgical History:  Procedure Laterality Date  . ABDOMINAL HYSTERECTOMY    . APPENDECTOMY    . CHOLECYSTECTOMY    . PANCREAS SURGERY       OB History   No obstetric history on file.     No family history on file.  Social History   Tobacco Use  . Smoking status: Never Smoker  . Smokeless tobacco: Never Used    Home Medications Prior to Admission medications   Medication Sig Start Date End Date Taking? Authorizing Provider  acetaminophen (TYLENOL) 500 MG tablet Take 500 mg by mouth every morning.    [provider]  aspirin EC  81 MG tablet Take 81 mg by mouth every morning.     [provider]  beta carotene w/minerals (OCUVITE) tablet Take 1 tablet by mouth daily at 12 noon.    [provider]  Calcium Carb-Ergocalciferol 250-125 MG-UNIT TABS Take 1 tablet by mouth daily at 12 noon.    [provider]  celecoxib (CELEBREX) 200 MG capsule Take 200 mg by mouth 2 (two) times daily.    [provider]  Cholecalciferol (VITAMIN D3) 2000 units capsule Take 2,000 Units by mouth daily at 12 noon.    [provider]  Cyanocobalamin (RA VITAMIN B-12 TR) 1000 MCG TBCR Take 1,000 mcg by mouth daily.    [provider]  diltiazem (CARDIZEM) 30 MG tablet Take 30 mg by mouth See admin instructions. 30 mg twice a day.  May take an additional 30 mg if needed for palpitations    [provider]  divalproex (DEPAKOTE ER) 500 MG 24 hr tablet Take 500 mg by mouth every morning. 06/26/16   [provider]  flecainide (TAMBOCOR) 100 MG tablet Take 100 mg by mouth See admin instructions. 100 mg twice a day.  May take an additional 100 mg for arrhythmias    [provider]  fluticasone (FLONASE) 50 MCG/ACT nasal spray Place 1 spray into  both nostrils daily.     [provider]  Lactobacillus Rhamnosus, GG, (CULTURELLE PO) Take 1 tablet by mouth daily as needed (for digestive health).     [provider]  levothyroxine (SYNTHROID, LEVOTHROID) 100 MCG tablet Take 1 tablet (100 mcg total) by mouth daily before breakfast. LABS NEEDED FOR FURTHER REFILLS 02/22/16   Sunnie Nielsen, DO  methylPREDNISolone (MEDROL DOSEPAK) 4 MG TBPK tablet Follow package instructions 06/28/16   Lyndal Pulley, MD  nortriptyline (PAMELOR) 25 MG capsule Take 25 mg by mouth at bedtime.  06/26/16   [provider]    Allergies    Beta adrenergic blockers  Review of Systems   Review of Systems  Unable to perform ROS: Mental status change    Physical Exam Updated  Vital Signs BP (!) 102/45   Pulse 72   Temp (!) 100.6 F (38.1 C) (Rectal)   Resp 18   SpO2 94%   Physical Exam Vitals and nursing note reviewed.  Eyes:     Pupils: Pupils are equal, round, and reactive to light.  Cardiovascular:     Rate and Rhythm: Normal rate.  Pulmonary:     Effort: Pulmonary effort is normal. No respiratory distress.  Abdominal:     Tenderness: There is no abdominal tenderness.  Musculoskeletal:        General: No tenderness.     Cervical back: Neck supple.     ED Results / Procedures / Treatments   Labs (all labs ordered are listed, but only abnormal results are displayed) Labs Reviewed  COMPREHENSIVE METABOLIC PANEL - Abnormal; Notable for the following components:      Result Value   Glucose, Bld 126 (*)    All other components within normal limits  CBC WITH DIFFERENTIAL/PLATELET - Abnormal; Notable for the following components:   WBC 14.8 (*)    RBC 3.78 (*)    Hemoglobin 11.4 (*)    HCT 35.3 (*)    Neutro Abs 11.0 (*)    All other components within normal limits  APTT - Abnormal; Notable for the following components:   aPTT 23 (*)    All other components within normal limits  URINALYSIS, ROUTINE W REFLEX MICROSCOPIC - Abnormal; Notable for the following components:   Hgb urine dipstick MODERATE (*)    Nitrite POSITIVE (*)    Leukocytes,Ua TRACE (*)    Bacteria, UA RARE (*)    All other components within normal limits  VALPROIC ACID LEVEL - Abnormal; Notable for the following components:   Valproic Acid Lvl <10 (*)    All other components within normal limits  SARS CORONAVIRUS 2 BY RT PCR (HOSPITAL ORDER, PERFORMED IN [facility])  URINE CULTURE  CULTURE, BLOOD (ROUTINE X 2)  CULTURE, BLOOD (ROUTINE X 2)  LACTIC ACID, PLASMA  PROTIME-INR  POC SARS CORONAVIRUS 2 AG -  ED    EKG EKG Interpretation  Date/Time:  Tuesday November 24 2020 11:45:49 EST Ventricular Rate:  76 PR Interval:    QRS Duration: 108 QT  Interval:  414 QTC Calculation: 466 R Axis:   63 Text Interpretation: Sinus rhythm No acute changes No old tracing to compare Confirmed by Derwood Kaplan [phone]) on 11/24/2020 11:48:05 AM   Radiology CT Head Wo Contrast  Result Date: 11/24/2020 CLINICAL DATA:  Mental status change. EXAM: CT HEAD WITHOUT CONTRAST TECHNIQUE: Contiguous axial images were obtained from the base of the skull through the vertex without intravenous contrast. COMPARISON:  None. FINDINGS: Brain: No  evidence of acute large vascular territory infarction, hemorrhage, hydrocephalus, extra-axial collection or mass lesion/mass effect. Patchy white matter hypoattenuation, most likely related to chronic microvascular ischemic disease. 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 Generalized cerebral atrophy with ex vacuo ventricular dilation. Vascular: No hyperdense vessel identified. Calcific atherosclerosis. Skull: No acute fracture. Sinuses/Orbits: Ethmoid air cell mucosal thickening without air-fluid levels. Unremarkable orbits. Other: No mastoid effusions. IMPRESSION: 1. No specific evidence of acute intracranial abnormality. 2. 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 An MRI could better evaluate for acute ischemia if there is clinical concern. 3. Generalized cerebral atrophy. Electronically Signed   By: Feliberto Harts MD   On: 11/24/2020 12:56   DG Pelvis Portable  Result Date: 11/24/2020 CLINICAL DATA:  Unwitnessed fall, altered mental status EXAM: PORTABLE PELVIS 1-2 VIEWS COMPARISON:  None. FINDINGS: No pelvic fracture or diastasis. Degenerative changes in the visualized lower lumbar spine. No evidence of hip dislocation on this single frontal view. No significant hip arthropathy. No suspicious focal osseous lesions. IMPRESSION: No pelvic fracture. Electronically Signed   By: Delbert Phenix M.D.   On: 11/24/2020 12:46   DG Chest Port  1 View  Result Date: 11/24/2020 CLINICAL DATA:  Atrial fibrillation, altered mental status, fall EXAM: PORTABLE CHEST 1 VIEW COMPARISON:  None. FINDINGS: Normal heart size. Atherosclerotic aortic arch. Otherwise normal mediastinal contour. No pneumothorax. No pleural effusion. No pulmonary edema. Mild left basilar atelectasis. IMPRESSION: Mild left basilar atelectasis. Otherwise no active cardiopulmonary disease. Electronically Signed   By: Delbert Phenix M.D.   On: 11/24/2020 12:43    Procedures .Critical Care Performed by: Derwood Kaplan, MD Authorized by: Derwood Kaplan, MD   Critical care provider statement:    Critical care time (minutes):  58   Critical care was necessary to treat or prevent imminent or life-threatening deterioration of the following conditions:  Sepsis and CNS failure or compromise   Critical care was time spent personally by me on the following activities:  Discussions with consultants, evaluation of patient's response to treatment, examination of patient, ordering and performing treatments and interventions, ordering and review of laboratory studies, ordering and review of radiographic studies, pulse oximetry, re-evaluation of patient's condition, obtaining history from patient or surrogate and review of old charts     Medications Ordered in ED Medications  lactated ringers infusion ( Intravenous New Bag/Given 11/24/20 1251)  cefTRIAXone (ROCEPHIN) 2 g in sodium chloride 0.9 % 100 mL IVPB (0 g Intravenous Stopped 11/24/20 1500)    ED Course  I have reviewed the triage vital signs and the nursing notes.  Pertinent labs & imaging results that were available during my care of the patient were reviewed by me and considered in my medical decision making (see chart for details).  Clinical Course as of 11/24/20 1708  Tue Nov 24, 2020  1530 Nitrite(!): POSITIVE UA is nitrite positive, leukocyte positive. She has no pyuria. Patient developed a low-grade fever on rectal temp  -clinical suspicion is high for UTI. Patient allegedly per nursing home has had mental status change with UTI. Plan is to give her antibiotics, if she does not improve then she can receive MRI. Patient will need admission to the hospital. On exam she is noted to be moving all 4 extremities and is more alert than she was when she first arrived [AN]  1706 Discussed case with patient's daughter. Normally when patient gets UTIs she is hallucinating and confused. She normally  does not " shut down" I have reassessed the patient. She still moving all 4 extremities. She is actually more alert than she was when she first arrived. She still not able to hold a conversation. Daughter reports that patient had a steady decline since New Year's of 2021 when she had acquired Covid.  Plan is to stay with the plan of admission for UTI leading to encephalopathy.  If patient does not get better, then MRI brain would be helpful to rule out stroke. She is not a TPA candidate regardless as she was last normal last night. [AN]    Clinical Course User Index [AN] Derwood Kaplan, MD   MDM Rules/Calculators/A&P                          80 year old female with history of A. fib comes in with chief complaint of altered mental status from assisted living facility.  At baseline patient is able to ambulate on her own and able to hold a simple conversation.  She does have dementia, but her confusion has worsened over the past few days and today she was unable to walk.  DDx includes: ICH / Stroke Sepsis syndrome Infection - UTI/Pneumonia Encephalopathy  Electrolyte abnormality Drug overdose DKA Metabolic disorders including thyroid disorders, adrenal insufficiency Medication side effect Seizures  We will initiate appropriate work-up.  I was informed that patient had a negative COVID-19 test last week and there is no outbreak.  COVID-19 is possible given the weakness, but other possibilities include UTI, stroke and even  seizure.  Plan is to get basic lab work-up and then reassess.    Final Clinical Impression(s) / ED Diagnoses Final diagnoses:  Acute encephalopathy  Severe sepsis [facility])    Rx / DC Orders ED Discharge Orders    None       Derwood Kaplan, MD 11/24/20 1212    Derwood Kaplan, MD 11/24/20 1708

## 2020-11-24 NOTE — ED Notes (Signed)
Spoke with daughter Balinda Quails, who is also POA. Provided brief update.

## 2020-11-25 ENCOUNTER — Other Ambulatory Visit: Payer: Self-pay

## 2020-11-25 ENCOUNTER — Encounter (HOSPITAL_COMMUNITY): Payer: Self-pay | Admitting: Internal Medicine

## 2020-11-25 DIAGNOSIS — I480 Paroxysmal atrial fibrillation: Secondary | ICD-10-CM

## 2020-11-25 DIAGNOSIS — G9341 Metabolic encephalopathy: Secondary | ICD-10-CM

## 2020-11-25 DIAGNOSIS — N390 Urinary tract infection, site not specified: Principal | ICD-10-CM

## 2020-11-25 DIAGNOSIS — F0390 Unspecified dementia without behavioral disturbance: Secondary | ICD-10-CM

## 2020-11-25 DIAGNOSIS — E039 Hypothyroidism, unspecified: Secondary | ICD-10-CM

## 2020-11-25 LAB — CBC
HCT: 31.4 % — ABNORMAL LOW (ref 36.0–46.0)
Hemoglobin: 10 g/dL — ABNORMAL LOW (ref 12.0–15.0)
MCH: 30.2 pg (ref 26.0–34.0)
MCHC: 31.8 g/dL (ref 30.0–36.0)
MCV: 94.9 fL (ref 80.0–100.0)
Platelets: 254 10*3/uL (ref 150–400)
RBC: 3.31 MIL/uL — ABNORMAL LOW (ref 3.87–5.11)
RDW: 13.2 % (ref 11.5–15.5)
WBC: 9.4 10*3/uL (ref 4.0–10.5)
nRBC: 0 % (ref 0.0–0.2)

## 2020-11-25 LAB — MRSA PCR SCREENING: MRSA by PCR: NEGATIVE

## 2020-11-25 LAB — BASIC METABOLIC PANEL
Anion gap: 9 (ref 5–15)
BUN: 13 mg/dL (ref 8–23)
CO2: 25 mmol/L (ref 22–32)
Calcium: 8.5 mg/dL — ABNORMAL LOW (ref 8.9–10.3)
Chloride: 107 mmol/L (ref 98–111)
Creatinine, Ser: 0.8 mg/dL (ref 0.44–1.00)
GFR, Estimated: >60 mL/min (ref >60)
Glucose, Bld: 89 mg/dL (ref 70–99)
Potassium: 3.4 mmol/L — ABNORMAL LOW (ref 3.5–5.1)
Sodium: 141 mmol/L (ref 135–145)

## 2020-11-25 MED ORDER — HALOPERIDOL LACTATE 5 MG/ML IJ SOLN
2.0000 mg | Freq: Once | INTRAMUSCULAR | Status: AC
  Administered 2020-11-25: 2 mg via INTRAVENOUS
  Filled 2020-11-25: qty 1

## 2020-11-25 MED ORDER — ENSURE ENLIVE PO LIQD
237.0000 mL | Freq: Two times a day (BID) | ORAL | Status: DC
  Administered 2020-11-25 – 2020-11-26 (×2): 237 mL via ORAL

## 2020-11-25 NOTE — Progress Notes (Signed)
Cardizem held due to low blood pressure, 90/39.   Also, patient refused PO meds.  She spit out the two PO meds she was given and refused to take them. On call provider notified.    Patient continued on yellow mews after arriving on the floor.   Renda Rolls, RN

## 2020-11-25 NOTE — Evaluation (Signed)
Occupational Therapy Evaluation Patient Details Name: Marie Maldonado MRN: [id number] DOB: 1941-01-27 Today's Date: 11/25/2020    History of Present Illness Marie Maldonado is a 80 y.o. female with medical history significant of atrial fibrillation and hypothyroidism.  She is a resident from assisted living.  Apparently her husband died about 2 to 3 weeks ago, since then she had a rapid decline in cognitive function.  Care givers noted a change in her mentation, with confusion and hallucinations.   Clinical Impression   Marie Maldonado is a 80 year old woman with the above medical history who presents supine in bed pleasantly confused and hard of hearing. On evaluation patient min guard to transfer to side of bed and hand hold assist to ambulate (for safety as patient in unfamiliar environment). Patient required verbal cues to perform toileting and grooming task and to pull up socks but no physical assistance needed. Patient confused and pulling at lines and leads but otherwise able to do as therapist asked - though needed repeated commands due to [facility]. Patient appears to be near her baseline. Recommend return to ALF.    Follow Up Recommendations  No OT follow up    Equipment Recommendations  None recommended by OT    Recommendations for Other Services       Precautions / Restrictions Precautions Precautions: None Restrictions Weight Bearing Restrictions: No      Mobility Bed Mobility Overal bed mobility: Needs Assistance Bed Mobility: Supine to Sit     Supine to sit: Min guard          Transfers Overall transfer level: Needs assistance Equipment used: 1 person hand held assist Transfers: Sit to/from Stand;Stand Pivot Transfers           General transfer comment: Hand hold assist for ambulation in room.    Balance Overall balance assessment: No apparent balance deficits (not formally assessed)                                         ADL either  performed or assessed with clinical judgement   ADL Overall ADL's : At baseline                                       General ADL Comments: Appears to be at baseline in regards to ADLs. Needs verbal cues for new environment and not typical clothing but otherwise able to perform. Set up and supervision for all ADLs.     Vision   Vision Assessment?: No apparent visual deficits     Perception     Praxis      Pertinent Vitals/Pain Pain Assessment: No/denies pain     Hand Dominance     Extremity/Trunk Assessment Upper Extremity Assessment Upper Extremity Assessment: Overall WFL for tasks assessed   Lower Extremity Assessment Lower Extremity Assessment: Defer to PT evaluation   Cervical / Trunk Assessment Cervical / Trunk Assessment: Normal   Communication     Cognition Arousal/Alertness: Awake/alert Behavior During Therapy: WFL for tasks assessed/performed Overall Cognitive Status: History of cognitive impairments - at baseline                                 General Comments: dementia   General Comments  Exercises     Shoulder Instructions      Home Living Family/patient expects to be discharged to:: Assisted living                             Home Equipment: None   Additional Comments: From memory Care unit.      Prior Functioning/Environment Level of Independence: Needs assistance  Gait / Transfers Assistance Needed: no device ADL's / Homemaking Assistance Needed: had some assistance for ADLs. Daughter reports she could perform some ADLs herself.            OT Problem List:        OT Treatment/Interventions:      OT Goals(Current goals can be found in the care plan section) Acute Rehab OT Goals OT Goal Formulation: All assessment and education complete, DC therapy  OT Frequency:     Barriers to D/C:            Co-evaluation              AM-PAC OT "6 Clicks" Daily Activity      Outcome Measure Help from another person eating meals?: A Little Help from another person taking care of personal grooming?: A Little Help from another person toileting, which includes using toliet, bedpan, or urinal?: A Little Help from another person bathing (including washing, rinsing, drying)?: A Little Help from another person to put on and taking off regular upper body clothing?: A Little Help from another person to put on and taking off regular lower body clothing?: A Little 6 Click Score: 18   End of Session Nurse Communication: Mobility status  Activity Tolerance: Patient tolerated treatment well Patient left: in chair;with call bell/phone within reach;with chair alarm set                   Time: 1012-1030 OT Time Calculation (min): 18 min Charges:  OT General Charges $OT Visit: 1 Visit OT Evaluation $OT Eval Low Complexity: 1 Low  Finlee Milo, OTR/L Acute Care Rehab Services  Office [phone] Pager: [phone]   Kelli Churn 11/25/2020, 11:43 AM

## 2020-11-25 NOTE — Progress Notes (Signed)
Patient had no documented urine output since previous shift and no evidence of urine output this shift. Bladder scan performed: 268 mL. Provider order to in/out cath.  Patient was very resistant, verbally threatening and attempting to kick nurse.  This nurse and charge nurse decided to put patient on bedside commode.  Patient was compliant and had 400 mL output.    Renda Rolls, RN

## 2020-11-25 NOTE — Progress Notes (Signed)
PT Cancellation Note  Patient Details Name: Marie Maldonado MRN: [id number] DOB: 05-Dec-1940   Cancelled Treatment:    Reason Eval/Treat Not Completed: Patient declined, no reason specified (Patient agitated and disoriented, pt declined mobility today despite encouragement and education from therpist, pt unable to follow conversation and tangental. Acute therapy to follow up as schedule allows.)  Marie Maldonado, SPT  Acute rehab   Marie Maldonado 11/25/2020, 7:09 PM

## 2020-11-25 NOTE — TOC Initial Note (Signed)
Transition of Care [facility]) - Initial/Assessment Note    Patient Details  Name: Marie Maldonado MRN: [id number] Date of Birth: [birth year]  Transition of Care [facility]) CM/SW Contact:    Marie Clam, RN Phone Number: 11/25/2020, 9:30 AM  Clinical Narrative:  From [facility] memory care-Indep ambulating;cueing for eating,& ADL's,cognitive impairement per rep Marie Maldonado.Per dtr Marie Maldonado d/c plan to return back to [facility]-ALF-memory care.await PT eval prior fl2.                 Expected Discharge Plan: Memory Care Barriers to Discharge: Continued Medical Work up   Patient Goals and CMS Choice Patient states their goals for this hospitalization and ongoing recovery are:: return back to memory care CMS Medicare.gov Compare Post Acute Care list provided to:: Patient Represenative (must comment) Marie Maldonado dtr) Choice offered to / list presented to : Adult Children  Expected Discharge Plan and Services Expected Discharge Plan: Memory Care   Discharge Planning Services: CM Consult   Living arrangements for the past 2 months: Assisted Living Facility (memory care)                                      Prior Living Arrangements/Services Living arrangements for the past 2 months: Assisted Living Facility (memory care) Lives with:: Facility Resident Patient language and need for interpreter reviewed:: Yes Do you feel safe going back to the place where you live?: Yes      Need for Family Participation in Patient Care: No (Comment) Care giver support system in place?: Yes (comment)   Criminal Activity/Legal Involvement Pertinent to Current Situation/Hospitalization: No - Comment as needed  Activities of Daily Living Home Assistive Devices/Equipment: None ADL Screening (condition at time of admission) Patient's cognitive ability adequate to safely complete daily activities?: No Is the patient deaf or have difficulty hearing?: No Does the patient have difficulty seeing, even when wearing  glasses/contacts?: No Does the patient have difficulty concentrating, remembering, or making decisions?: Yes Patient able to express need for assistance with ADLs?: No Does the patient have difficulty dressing or bathing?: Yes Independently performs ADLs?: No Communication: Independent Dressing (OT): Needs assistance Is this a change from baseline?: Pre-admission baseline Grooming: Independent Feeding: Independent Bathing: Needs assistance Is this a change from baseline?: Pre-admission baseline Toileting: Independent In/Out Bed: Independent Walks in Home: Independent Does the patient have difficulty walking or climbing stairs?: Yes Weakness of Legs: Both Weakness of Arms/Hands: Both  Permission Sought/Granted Permission sought to share information with : Case Manager Permission granted to share information with : Yes, Verbal Permission Granted  Share Information with NAME: Case Manager     Permission granted to share info w Relationship: Marie Maldonado dtr [phone]     Emotional Assessment Appearance:: Appears stated age Attitude/Demeanor/Rapport: Gracious Affect (typically observed): Accepting Orientation: : Oriented to Self Alcohol / Substance Use: Not Applicable Psych Involvement: No (comment)  Admission diagnosis:  Acute encephalopathy [G93.40] Sepsis (HCC) [A41.9] Severe sepsis (HCC) [A41.9, R65.20] Patient Active Problem List   Diagnosis Date Noted  . Sepsis (HCC) 11/24/2020  . Acute metabolic encephalopathy 11/24/2020  . Depression 11/24/2020  . AF (paroxysmal atrial fibrillation) (HCC) 11/24/2020  . Hypothyroidism 10/22/2015  . Osteoarthritis 10/22/2015   PCP:  Nadara Eaton, MD Pharmacy:   [facility] [phone] - [place], [place] - [address] [place] [address] Phone: [phone] Fax: [phone]  Social Determinants of Health (SDOH) Interventions    Readmission Risk Interventions No  flowsheet data found.

## 2020-11-25 NOTE — Evaluation (Signed)
Clinical/Bedside Swallow Evaluation Patient Details  Name: Marie Maldonado MRN: [id number] Date of Birth: 03/24/1941  Today's Date: 11/25/2020 Time: SLP Start Time (ACUTE ONLY): 1300 SLP Stop Time (ACUTE ONLY): 1330 SLP Time Calculation (min) (ACUTE ONLY): 30 min  Past Medical History:  Past Medical History:  Diagnosis Date  . Atrial fibrillation (HCC)   . Hypothyroidism 10/22/2015  . Osteoarthritis 10/22/2015   Past Surgical History:  Past Surgical History:  Procedure Laterality Date  . ABDOMINAL HYSTERECTOMY    . APPENDECTOMY    . CHOLECYSTECTOMY    . PANCREAS SURGERY     HPI:  80yo female admitted from ALF 11/24/20 with AMS. PMH: AFib, hypothyroidism. Husband died 2-3 weeks prior to admit, pt with rapid cognitive decline after that. CTHead = no acute abnormality   Assessment / Plan / Recommendation Clinical Impression  Pt seen at bedside for evaluation of swallow function and safety. Pt was seated upright in recliner, awake and cooperative. Pt was noted to be pleasantly confused and had difficulty following directions. CN exam unremarkable, and dentition appears adequate. Pt accepted trials of thin liquid, puree, and solid textures. Oral phase appears adequate. Swallow appears timely without overt s/s aspiration or multiple swallows. Will continue regular diet and thin liquids. Pt would benefit from set up and assistance with self feeding due to significant confusion. Safe swallow precautions posted at [facility]. ST signing off. Please reconsult if needs arise. SLP Visit Diagnosis: Dysphagia, unspecified (R13.10)    Aspiration Risk  Mild aspiration risk    Diet Recommendation Regular;Thin liquid   Liquid Administration via: Cup;Straw Medication Administration:  (as tolerated) Supervision: Patient able to self feed;Staff to assist with self feeding;Full supervision/cueing for compensatory strategies Compensations: Minimize environmental distractions;Slow rate;Small sips/bites Postural  Changes: Seated upright at 90 degrees    Other  Recommendations Oral Care Recommendations: Oral care BID   Follow up Recommendations 24 hour supervision/assistance          Prognosis Prognosis for Safe Diet Advancement: Good Barriers to Reach Goals: Cognitive deficits      Swallow Study   General Date of Onset: 11/24/20 HPI: 80yo female admitted from ALF 11/24/20 with AMS. PMH: AFib, hypothyroidism. Husband died 2-3 weeks prior to admit, pt with rapid cognitive decline after that. CTHead = no acute abnormality Type of Study: Bedside Swallow Evaluation Previous Swallow Assessment: none Diet Prior to this Study: Regular;Thin liquids Temperature Spikes Noted: No Respiratory Status: Room air History of Recent Intubation: No Behavior/Cognition: Cooperative;Confused;Pleasant mood;Alert;Requires cueing Oral Cavity Assessment: Within Functional Limits Oral Care Completed by SLP: No Oral Cavity - Dentition: Adequate natural dentition Vision: Functional for self-feeding Self-Feeding Abilities: Able to feed self;Needs set up;Needs assist Patient Positioning: Upright in chair Baseline Vocal Quality: Normal Volitional Cough: Weak Volitional Swallow: Able to elicit    Oral/Motor/Sensory Function Overall Oral Motor/Sensory Function: Within functional limits      Thin Liquid Thin Liquid: Within functional limits Presentation: Straw    Puree Puree: Within functional limits Presentation: Self Fed;Spoon   Solid     Solid: Within functional limits Presentation: Self Fed     Celia B. Murvin Natal, [facility], CCC-SLP Speech Language Pathologist Office: [phone]  Marie Maldonado 11/25/2020,1:32 PM

## 2020-11-25 NOTE — Hospital Course (Signed)
80 year old woman PMH dementia, resides in memory care assisted living, recent rapid decline in cognitive function with the death of her husband in the last several weeks.  Presented with change in mentation, confusion.  Admitted for acute metabolic encephalopathy secondary to urinary tract infection.  A & P  Acute metabolic encephalopathy secondary to UTI. --Per daughter at bedside the patient appears to be mentally back to baseline.  Antibiotics today.  Likely discharge tomorrow.  Atrial fibrillation --Stable.  Continue diltiazem and flecainide.  Not on anticoagulation as an outpatient.  Hypothyroidism continue levothyroxine.  Dementia --Appears stable.  Return to memory care ALF on discharge.

## 2020-11-25 NOTE — Progress Notes (Signed)
PROGRESS NOTE  Marie Maldonado DHR:[id number] DOB: 01/15/1941 DOA: 11/24/2020 PCP: Nadara Eaton, MD  Brief History   80 year old woman PMH dementia, resides in memory care assisted living, recent rapid decline in cognitive function with the death of her husband in the last several weeks.  Presented with change in mentation, confusion.  Admitted for acute metabolic encephalopathy secondary to urinary tract infection.  A & P  Acute metabolic encephalopathy secondary to UTI. --Per daughter at bedside the patient appears to be mentally back to baseline.  Antibiotics today.  Likely discharge tomorrow.  Atrial fibrillation --Stable.  Continue diltiazem and flecainide.  Not on anticoagulation as an outpatient.  Hypothyroidism continue levothyroxine.  Dementia --Appears stable.  Return to memory care ALF on discharge.   Disposition Plan:  Discussion: If continues to improve likely return to ALF tomorrow.  Status is: Inpatient  Remains inpatient appropriate because:IV treatments appropriate due to intensity of illness or inability to take PO   Dispo: The patient is from: ALF              Anticipated d/c is to: ALF              Anticipated d/c date is: 1 day              Patient currently is not medically stable to d/c.   Difficult to place patient No  DVT prophylaxis: enoxaparin (LOVENOX) injection 40 mg Start: 11/24/20 2200 SCDs Start: 11/24/20 1945   Code Status: Full Code Level of care: Med-Surg Family Communication: daughter at bedside  Brendia Sacks, MD  [facility] Direct contact: see [email or web address] (further directions at bottom of note if needed) 7PM-7AM contact night coverage as at bottom of note 11/25/2020, 8:53 PM  LOS: 1 day   Significant Hospital Events   .    Consults:  .    Procedures:  .   Significant Diagnostic Tests:  Marland Kitchen    Micro Data:  .    Antimicrobials:  .   Interval History/Subjective  CC: f/u confusion  Feels fine no  complaints Confused Daughter at bedside, reports patient seems to be at mental baseline. Pt in memory care. Has fine intentional tremor both hands, chronic, familial.  Objective   Vitals:  Vitals:   11/25/20 0824 11/25/20 1400  BP: (!) 90/53 (!) 99/46  Pulse: 66 76  Resp: 18 18  Temp: 98.6 F (37 C) 97.9 F (36.6 C)  SpO2: 93% 100%    Exam:  Constitutional:   . Appears calm and comfortable sitting in chair. ENMT:  . grossly normal hearing  Respiratory:  . CTA bilaterally, no w/r/r.  . Respiratory effort normal.  Cardiovascular:  . RRR, no m/r/g . No LE extremity edema   . Telemetry SR Musculoskeletal:  . RLE, LLE   . Moves both legs to command Psychiatric:  . Mental status o Mood, affect appropriate  I have personally reviewed the following:   Today's Data  . BMP stable, K+ 3.4 . Hgb slightly lower with fluids  Scheduled Meds: . aspirin EC  81 mg Oral BH-q7a  . calcium-vitamin D  1 tablet Oral Daily  . celecoxib  200 mg Oral BID  . cholecalciferol  2,000 Units Oral Daily  . diltiazem  30 mg Oral BID  . divalproex  500 mg Oral BH-q7a  . enoxaparin (LOVENOX) injection  40 mg Subcutaneous Q24H  . feeding supplement  237 mL Oral BID BM  . flecainide  100 mg Oral BID  .  fluticasone  1 spray Each Nare Daily  . levothyroxine  100 mcg Oral Q0600  . multivitamin  1 tablet Oral Daily  . multivitamin  15 mL Oral Daily  . nortriptyline  25 mg Oral QHS  . thiamine injection  100 mg Intravenous Daily   Continuous Infusions: . cefTRIAXone (ROCEPHIN)  IV 1 g (11/25/20 1004)    Principal Problem:   Acute metabolic encephalopathy Active Problems:   Hypothyroidism   Depression   AF (paroxysmal atrial fibrillation) (HCC)   Acute lower UTI   Dementia without behavioral disturbance (HCC)   LOS: 1 day   How to contact the [facility] Attending or Consulting provider 7A - 7P or covering provider during after hours 7P -7A, for this patient?  1. Check the care team in [facility]  and look for a) attending/consulting TRH provider listed and b) the [facility] team listed 2. Log into [email or web address] and use [place] universal password to access. If you do not have the password, please contact the hospital operator. 3. Locate the [facility] provider you are looking for under [facility] and page to a number that you can be directly reached. 4. If you still have difficulty reaching the provider, please page the [facility] (Director on Call) for the Hospitalists listed on amion for assistance.

## 2020-11-25 NOTE — Progress Notes (Signed)
Pt worked with OT today. She ambulated halls with me twice today. She was very confused all throughout the day. She sat up in chair most of day with alarm on. She was for the most part pleasantly confused until 1800 & then became aggressive & non-compliant with staff, pulling at things & trying to leave room around 1900. MD Blount paged concerning this & requested a prn medication to help calm her down.

## 2020-11-26 MED ORDER — THIAMINE HCL 100 MG PO TABS: 100.0000 mg | ORAL_TABLET | Freq: Every day | ORAL | Status: DC

## 2020-11-26 MED ORDER — CEFUROXIME AXETIL 500 MG PO TABS: 500.0000 mg | ORAL_TABLET | Freq: Two times a day (BID) | ORAL | 0 refills | Status: AC

## 2020-11-26 NOTE — Progress Notes (Signed)
PHARMACIST - PHYSICIAN COMMUNICATION  DR:   Irene Limbo  CONCERNING: IV to Oral Route Change Policy  RECOMMENDATION: This patient is receiving thiamine by the intravenous route.  Based on criteria approved by the Pharmacy and Therapeutics Committee, the intravenous medication(s) is/are being converted to the equivalent oral dose form(s).   DESCRIPTION: These criteria include:  The patient is eating (either orally or via tube) and/or has been taking other orally administered medications for a least 24 hours  The patient has no evidence of active gastrointestinal bleeding or impaired GI absorption (gastrectomy, short bowel, patient on TNA or NPO).  If you have questions about this conversion, please contact the Pharmacy Department  []   [phone] )  ( [phone] []   [phone] )  [facility] []   [phone] )  [facility] []   [phone] )  [facility] [x]   [phone] )  [facility]   ( [phone], [facility] 11/26/2020 9:28 AM

## 2020-11-26 NOTE — Discharge Summary (Signed)
Physician Discharge Summary  Marie Maldonado IOX:[id number] DOB: 1941-01-21 DOA: 11/24/2020  PCP: Nadara Eaton, MD  Admit date: 11/24/2020 Discharge date: 11/26/2020  Recommendations for Outpatient Follow-up:  UTI. --Urine culture growing E. coli, sensitivities pending, 3 days of Rocephin in hospital.  4 additional days cephalosporin on discharge.  Follow-up culture data.  No changes were made to her chronic medications.  Compare discharge medication list to patient's medications prior to admission and reconcile as appropriate.   Follow-up Information    Nadara Eaton, MD. Schedule an appointment as soon as possible for a visit in 1 week(s).   Specialty: Internal Medicine Contact information: [address] [place] [address] [phone]               Discharge Diagnoses: Principal diagnosis is #1 Principal Problem:   Acute metabolic encephalopathy Active Problems:   Hypothyroidism   Depression   AF (paroxysmal atrial fibrillation) (HCC)   Acute lower UTI   Dementia without behavioral disturbance [facility])   Discharge Condition: improved Disposition: return to ALF memory care  Diet recommendation:  Diet Orders (From admission, onward)    Start     Ordered   11/26/20 0000  Diet - low sodium heart healthy        11/26/20 1223   11/24/20 1837  Diet Heart Room service appropriate? Yes; Fluid consistency: Thin  Diet effective now       Question Answer Comment  Room service appropriate? Yes   Fluid consistency: Thin      11/24/20 1836           Filed Weights   11/24/20 1756  Weight: 59 kg    HPI/Hospital Course:   80 year old woman PMH dementia, resides in memory care assisted living, recent rapid decline in cognitive function with the death of her husband in the last several weeks.  Presented with change in mentation, confusion.  Admitted for acute metabolic encephalopathy secondary to urinary tract infection.  Treated with empiric antibiotics with rapid  clinical improvement, mental status at baseline.  Hospitalization uncomplicated.  Individual issues as below.  Return to ALF memory care unit.  A & P  Acute metabolic encephalopathy secondary to UTI. --Back to baseline.  Afebrile.  Hemodynamic stable.  Urine culture growing E. coli, sensitivities pending, 3 days of Rocephin in hospital.  4 additional days cephalosporin on discharge.  Follow-up culture data.  Atrial fibrillation --Remained stable.  To continue flecainide.  Not on anticoagulation as an outpatient.  Hypothyroidism continue levothyroxine.  Dementia --Appears to be at baseline.  Return to memory care ALF on discharge.      Micro Data:  . UC >100k E coli   Antimicrobials:  . Ceftriaxone 2/1 > 2/3 . Ceftin 2/4 > 2/7  Today's assessment: S: CC: f/u confusion  Confused last night Doing well today, did well with PT No complaints.  O: Vitals:  Vitals:   11/25/20 2302 11/26/20 0923  BP: 123/60 125/62  Pulse: 73   Resp:    Temp: 98.7 F (37.1 C)   SpO2: 97%     Constitutional:  . Appears calm and comfortable sitting in chair ENMT:  . grossly normal hearing  Respiratory:  . CTA bilaterally, no w/r/r.  . Respiratory effort normal.  Cardiovascular:  . RRR, no m/r/g . No LE extremity edema   Musculoskeletal:  . Seen ambulating in hallway w/ PT Psychiatric:  . Mental status o Mood, affect appropriate o confused  UC GNR  Discharge Instructions  Discharge  Instructions    Diet - low sodium heart healthy   Complete by: As directed    Discharge instructions   Complete by: As directed    Call your physician or seek immediate medical attention for fever, confusion, pain, falls or worsening of condition.   Increase activity slowly   Complete by: As directed      Allergies as of 11/26/2020      Reactions   Beta Adrenergic Blockers Other (See Comments)   Pt states they make her feel "disconnected."      Medication List    TAKE these medications    acetaminophen 500 MG tablet Commonly known as: TYLENOL Take 500 mg by mouth every 6 (six) hours as needed (pain). What changed: Another medication with the same name was removed. Continue taking this medication, and follow the directions you see here.   aspirin EC 81 MG tablet Take 81 mg by mouth every morning.   beta carotene w/minerals tablet Take 1 tablet by mouth daily.   Multivitamin Adult Chew Chew 1 tablet by mouth daily.   cefUROXime 500 MG tablet Commonly known as: CEFTIN Take 1 tablet (500 mg total) by mouth 2 (two) times daily for 4 days. Start 2/4 AM   Chloraseptic 1.4 % Liqd Generic drug: phenol Use as directed 2 sprays in the mouth or throat as needed for throat irritation / pain (sore throat).   flecainide 100 MG tablet Commonly known as: TAMBOCOR Take 100 mg by mouth 2 (two) times daily. May take an additional 100 mg for arrhythmias   levothyroxine 112 MCG tablet Commonly known as: SYNTHROID Take 112 mcg by mouth daily before breakfast.   Melatonin 3 MG Caps Take 3 mg by mouth at bedtime.   nystatin powder Commonly known as: MYCOSTATIN/NYSTOP Apply 1 application topically as needed (infection under breast).   polyethylene glycol powder 17 GM/SCOOP powder Commonly known as: GLYCOLAX/MIRALAX Take 17 g by mouth as needed for mild constipation.   Tums Smoothies 750 MG chewable tablet Generic drug: calcium carbonate Chew 2 tablets by mouth as needed for heartburn.   Vitamin D3 50 MCG (2000 UT) capsule Take 2,000 Units by mouth daily at 12 noon.      Allergies  Allergen Reactions  . Beta Adrenergic Blockers Other (See Comments)    Pt states they make her feel "disconnected."    The results of significant diagnostics from this hospitalization (including imaging, microbiology, ancillary and laboratory) are listed below for reference.    Significant Diagnostic Studies: CT Head Wo Contrast  Result Date: 11/24/2020 CLINICAL DATA:  Mental status  change. EXAM: CT HEAD WITHOUT CONTRAST TECHNIQUE: Contiguous axial images were obtained from the base of the skull through the vertex without intravenous contrast. COMPARISON:  None. FINDINGS: Brain: No evidence of acute large vascular territory infarction, hemorrhage, hydrocephalus, extra-axial collection or mass lesion/mass effect. Patchy white matter hypoattenuation, most likely related to chronic microvascular ischemic disease. 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 Generalized cerebral atrophy with ex vacuo ventricular dilation. Vascular: No hyperdense vessel identified. Calcific atherosclerosis. Skull: No acute fracture. Sinuses/Orbits: Ethmoid air cell mucosal thickening without air-fluid levels. Unremarkable orbits. Other: No mastoid effusions. IMPRESSION: 1. No specific evidence of acute intracranial abnormality. 2. 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 An MRI could better evaluate for acute ischemia if there is clinical concern. 3. Generalized cerebral atrophy. Electronically Signed   By: Feliberto Harts MD   On:  11/24/2020 12:56   DG Pelvis Portable  Result Date: 11/24/2020 CLINICAL DATA:  Unwitnessed fall, altered mental status EXAM: PORTABLE PELVIS 1-2 VIEWS COMPARISON:  None. FINDINGS: No pelvic fracture or diastasis. Degenerative changes in the visualized lower lumbar spine. No evidence of hip dislocation on this single frontal view. No significant hip arthropathy. No suspicious focal osseous lesions. IMPRESSION: No pelvic fracture. Electronically Signed   By: Delbert Phenix M.D.   On: 11/24/2020 12:46   DG Chest Port 1 View  Result Date: 11/24/2020 CLINICAL DATA:  Atrial fibrillation, altered mental status, fall EXAM: PORTABLE CHEST 1 VIEW COMPARISON:  None. FINDINGS: Normal heart size. Atherosclerotic aortic arch. Otherwise normal mediastinal contour. No pneumothorax. No pleural effusion. No  pulmonary edema. Mild left basilar atelectasis. IMPRESSION: Mild left basilar atelectasis. Otherwise no active cardiopulmonary disease. Electronically Signed   By: Delbert Phenix M.D.   On: 11/24/2020 12:43    Microbiology: Recent Results (from the past 240 hour(s))  Blood Culture (routine x 2)     Status: None (Preliminary result)   Collection Time: 11/24/20 12:33 PM   Specimen: BLOOD  Result Value Ref Range Status   Specimen Description   Final    BLOOD LEFT ANTECUBITAL Performed at [facility], [address], [place], [place] [address]    Special Requests   Final    BOTTLES DRAWN AEROBIC AND ANAEROBIC Blood Culture results may not be optimal due to an excessive volume of blood received in culture bottles Performed at [facility], [address], [place], [place] [address]    Culture   Final    NO GROWTH 2 DAYS Performed at [facility], [address] [place], [place] [address]    Report Status PENDING  Incomplete  Blood Culture (routine x 2)     Status: None (Preliminary result)   Collection Time: 11/24/20 12:33 PM   Specimen: BLOOD  Result Value Ref Range Status   Specimen Description   Final    BLOOD LEFT FOOT Performed at [facility], [address], [place], [place] [address]    Special Requests   Final    BOTTLES DRAWN AEROBIC AND ANAEROBIC Blood Culture adequate volume Performed at [facility], [address], [place], [place] [address]    Culture   Final    NO GROWTH 2 DAYS Performed at [facility], [address] [place], [place] [address]    Report Status PENDING  Incomplete  SARS Coronavirus 2 by RT PCR (hospital order, performed in [facility] hospital lab) Nasopharyngeal Nasopharyngeal Swab     Status: None   Collection Time: 11/24/20 12:50 PM   Specimen: Nasopharyngeal Swab  Result Value Ref Range Status   SARS Coronavirus 2 NEGATIVE NEGATIVE Final    Comment:  (NOTE) SARS-CoV-2 target nucleic acids are NOT DETECTED.  The SARS-CoV-2 RNA is generally detectable in upper and lower respiratory specimens during the acute phase of infection. The lowest concentration of SARS-CoV-2 viral copies this assay can detect is 250 copies / mL. A negative result does not preclude SARS-CoV-2 infection and should not be used as the sole basis for treatment or other patient management decisions.  A negative result may occur with improper specimen collection / handling, submission of specimen other than nasopharyngeal swab, presence of viral mutation(s) within the areas targeted by this assay, and inadequate number of viral copies (<250 copies / mL). A negative result must be combined with clinical observations,  patient history, and epidemiological information.  Fact Sheet for Patients:   BoilerBrush.com.cy  Fact Sheet for Healthcare Providers: https://pope.com/  This test is not yet approved or  cleared by the [place] FDA and has been authorized for detection and/or diagnosis of SARS-CoV-2 by FDA under an Emergency Use Authorization (EUA).  This EUA will remain in effect (meaning this test can be used) for the duration of the COVID-19 declaration under Section 564(b)(1) of the Act, 21 U.S.C. section 360bbb-3(b)(1), unless the authorization is terminated or revoked sooner.  Performed at [facility], [address] [place], [place] [address]   Urine culture     Status: Abnormal (Preliminary result)   Collection Time: 11/24/20  3:00 PM   Specimen: In/Out Cath Urine  Result Value Ref Range Status   Specimen Description   Final    IN/OUT CATH URINE Performed at [facility], [address] [place], [place] [address]    Special Requests   Final    NONE Performed at [facility], [address], [place], [place] [address]    Culture (A)  Final     >=100,000 COLONIES/mL ESCHERICHIA COLI SUSCEPTIBILITIES TO FOLLOW Performed at [facility], [address] [place], [place] [address]    Report Status PENDING  Incomplete  MRSA PCR Screening     Status: None   Collection Time: 11/25/20  4:43 AM   Specimen: Nasopharyngeal  Result Value Ref Range Status   MRSA by PCR NEGATIVE NEGATIVE Final    Comment:        The GeneXpert MRSA Assay (FDA approved for NASAL specimens only), is one component of a comprehensive MRSA colonization surveillance program. It is not intended to diagnose MRSA infection nor to guide or monitor treatment for MRSA infections. Performed at [facility], [address] [place], [place] [address]      Labs: Basic Metabolic Panel: Recent Labs  Lab 11/24/20 1233 11/25/20 0603  NA 139 141  K 3.7 3.4*  CL 103 107  CO2 25 25  GLUCOSE 126* 89  BUN 15 13  CREATININE 0.79 0.80  CALCIUM 9.1 8.5*   Liver Function Tests: Recent Labs  Lab 11/24/20 1233  AST 19  ALT 15  ALKPHOS 81  BILITOT 0.7  PROT 7.2  ALBUMIN 3.8   CBC: Recent Labs  Lab 11/24/20 1233 11/25/20 0603  WBC 14.8* 9.4  NEUTROABS 11.0*  --   HGB 11.4* 10.0*  HCT 35.3* 31.4*  MCV 93.4 94.9  PLT 300 254    Principal Problem:   Acute metabolic encephalopathy Active Problems:   Hypothyroidism   Depression   AF (paroxysmal atrial fibrillation) (HCC)   Acute lower UTI   Dementia without behavioral disturbance (HCC)   Time coordinating discharge: 35 minutes  Signed:  Brendia Sacks, MD  [facility]  11/26/2020, 12:23 PM

## 2020-11-26 NOTE — Evaluation (Signed)
Physical Therapy Evaluation Patient Details Name: Marie Maldonado MRN: [id number] DOB: 17-May-1941 Today's Date: 11/26/2020   History of Present Illness  patient is a 80 y.o. female with PMH significant for OA, HTN, a-fib, HOH, admission for UTI leading to encephalopathy.  Clinical Impression  Pt is a 79y.o. female with above HPI. Pt very pleasant and agreeable to session today. Pt is a poor historian, and disoriented to time and place. Pt able to follow commands with some verbal and tactile cuing for proper technique during session. Pt required MIN guard and verbal cues for sit to stand transfer for safety and steadying in standing. Pt required MIN assist-MIN guard for safety with ambulation 34ft with no LOB noted. Pt with 5 time sit to stand in 23.42s and BERG balance score of 22, indicating an increased risk for falls. Recommend return to ALF memory care unit where pt resides with supervision. Pt will benefit from skilled PT to increase independence and safety with mobility.      Follow Up Recommendations No PT follow up    Equipment Recommendations  None recommended by PT    Recommendations for Other Services       Precautions / Restrictions Precautions Precautions: Fall Restrictions Weight Bearing Restrictions: No      Mobility  Bed Mobility               General bed mobility comments: pt OOB in recliner    Transfers Overall transfer level: Modified independent Equipment used: None Transfers: Sit to/from Stand           General transfer comment: pt able to use B UEs on arm rests for power up to stand from recliner with MIN assist-MIN guard for steadying in standing.  Ambulation/Gait Ambulation/Gait assistance: Min assist;Min guard Gait Distance (Feet): 360 Feet Assistive device: None Gait Pattern/deviations: Step-through pattern;Decreased stride length;Narrow base of support Gait velocity: decr   General Gait Details: pt ambulated in hallway with MIN  assist-MIN guard for safety. Pt intermittently distracted during gait while walking  in hallway. No LOB observed and pt able to hold conversation with therapist.  Stairs            Wheelchair Mobility    Modified Rankin (Stroke Patients Only)       Balance Overall balance assessment: Mild deficits observed, not formally tested Sitting-balance support: Feet supported Sitting balance-Leahy Scale: Fair     Standing balance support: No upper extremity supported;During functional activity Standing balance-Leahy Scale: Fair Standing balance comment: pt able to maintain static standing balance with MIN guard during dynamic activities during session.                 Standardized Balance Assessment Standardized Balance Assessment : Berg Balance Test Berg Balance Test Sit to Stand: Needs minimal aid to stand or to stabilize Standing Unsupported: Able to stand 2 minutes with supervision Sitting with Back Unsupported but Feet Supported on Floor or Stool: Able to sit safely and securely 2 minutes Stand to Sit: Controls descent by using hands Transfers: Needs one person to assist Standing Unsupported with Eyes Closed: Able to stand 10 seconds with supervision Standing Ubsupported with Feet Together: Able to place feet together independently but unable to hold for 30 seconds From Standing, Reach Forward with Outstretched Arm: Reaches forward but needs supervision From Standing Position, Pick up Object from Floor: Able to pick up shoe, needs supervision From Standing Position, Turn to Look Behind Over each Shoulder: Needs assist to keep from losing balance and  falling Turn 360 Degrees: Needs close supervision or verbal cueing Standing Unsupported, Alternately Place Feet on Step/Stool: Needs assistance to keep from falling or unable to try Standing Unsupported, One Foot in Front: Loses balance while stepping or standing Standing on One Leg: Unable to try or needs assist to prevent  fall Total Score: 22         Pertinent Vitals/Pain Pain Assessment: No/denies pain    Home Living Family/patient expects to be discharged to:: Assisted living               Home Equipment: None Additional Comments: From memory Care unit.    Prior Function Level of Independence: Needs assistance   Gait / Transfers Assistance Needed: no device  ADL's / Homemaking Assistance Needed: pt reports she needs some assistance with ADLs  Comments: pt reports that she believes she has had falls in the past, but not many, pt is disoriented and confused, poor historian. No family present to confirm PLOF or cognitive status.     Hand Dominance        Extremity/Trunk Assessment   Upper Extremity Assessment Upper Extremity Assessment: Overall WFL for tasks assessed    Lower Extremity Assessment Lower Extremity Assessment: Generalized weakness    Cervical / Trunk Assessment Cervical / Trunk Assessment: Normal  Communication   Communication: Other (comment) (confused, pt disoriented to time and place)  Cognition Arousal/Alertness: Awake/alert Behavior During Therapy: WFL for tasks assessed/performed Overall Cognitive Status: History of cognitive impairments - at baseline                                        General Comments General comments (skin integrity, edema, etc.): 5x sit to stand 23.42s- pt with use of single UE support on arm rest for power up, indicates increased risk for falls    Exercises     Assessment/Plan    PT Assessment Patient needs continued PT services  PT Problem List Decreased strength;Decreased balance;Decreased mobility;Decreased safety awareness;Decreased cognition       PT Treatment Interventions Gait training;Functional mobility training;Therapeutic activities;Therapeutic exercise;Balance training;Patient/family education    PT Goals (Current goals can be found in the Care Plan section)  Acute Rehab PT Goals Patient  Stated Goal: none stated PT Goal Formulation: With patient Time For Goal Achievement: 12/10/20 Potential to Achieve Goals: Fair    Frequency Min 2X/week   Barriers to discharge        Co-evaluation               AM-PAC PT "6 Clicks" Mobility  Outcome Measure Help needed turning from your back to your side while in a flat bed without using bedrails?: None Help needed moving from lying on your back to sitting on the side of a flat bed without using bedrails?: None Help needed moving to and from a bed to a chair (including a wheelchair)?: A Little Help needed standing up from a chair using your arms (e.g., wheelchair or bedside chair)?: A Little Help needed to walk in hospital room?: A Little Help needed climbing 3-5 steps with a railing? : A Little 6 Click Score: 20    End of Session Equipment Utilized During Treatment: Gait belt Activity Tolerance: Patient tolerated treatment well Patient left: in chair;with call bell/phone within reach Nurse Communication: Mobility status PT Visit Diagnosis: Unsteadiness on feet (R26.81);Muscle weakness (generalized) (M62.81)    Time: 2353-6144 PT Time  Calculation (min) (ACUTE ONLY): 20 min   Charges:              Loyal Gambler, SPT  Acute rehab   Loyal Gambler 11/26/2020, 2:44 PM

## 2020-11-26 NOTE — Progress Notes (Signed)
Pt to be discharged back to [facility] this afternoon. Talmadge Chad RN accepting report for this facility. VSS and no acute changes noted at discharge with Pt's assessment

## 2020-11-26 NOTE — TOC Transition Note (Addendum)
Transition of Care [facility]) - CM/SW Discharge Note   Patient Details  Name: Marie Maldonado MRN: [id number] Date of Birth: 1941-04-21  [facility] of [facility]) CM/SW Contact:  Lanier Clam, RN Phone Number: 11/26/2020, 11:35 AM   Clinical Narrative: Sherron Monday to dtr [name] & [facility]- rep Audrey-agree to d/c plan patient to return-[facility] will provide own transport to pick patient up from hospital & transport back to [facility]-ALF-memory care-[facility]-nsg call report tel#[phone]. Packet @ Nsg station.[facility] rep [name] awaiting d/c summary(she can retrieve from epic). No further CM needs. covid neg 2/1-no need covid needed per [facility] rep [name].     Final next level of care: Memory Care Barriers to Discharge: No Barriers Identified   Patient Goals and CMS Choice Patient states their goals for this hospitalization and ongoing recovery are:: return back to memory care CMS Medicare.gov Compare Post Acute Care list provided to:: Patient Represenative (must comment) [name] dtr) Choice offered to / list presented to : Adult Children  Discharge Placement              Patient chooses bed at: [facility] (ALF-memory care) Patient to be transferred to facility by: [facility]-will provide own transport;dtr [name] in agreement Name of family member notified: [name] dtr [phone] Patient and family notified of of transfer: 11/26/20  Discharge Plan and Services   Discharge Planning Services: CM Consult                                 Social Determinants of Health (SDOH) Interventions     Readmission Risk Interventions No flowsheet data found.

## 2020-11-26 NOTE — NC FL2 (Signed)
[place] MEDICAID FL2 LEVEL OF CARE SCREENING TOOL     IDENTIFICATION  Patient Name: Marie Maldonado Birthdate: 14-Sep-1941 Sex: female Admission Date (Current Location): 11/24/2020  [facility] and [place] Number:  Producer, television/film/video and Address:  [facility],  [address] [place], [place] [address]      Provider Number: [id number]  Attending Physician Name and Address:  Standley Brooking, MD  Relative Name and Phone Number:  [name] (dtr) [phone]    Current Level of Care: Hospital Recommended Level of Care: Memory Care Prior Approval Number:    Date Approved/Denied:   PASRR Number:    Discharge Plan: Other (Comment) (ALF-Memory care)    Current Diagnoses: Patient Active Problem List   Diagnosis Date Noted  . Acute lower UTI 11/25/2020  . Dementia without behavioral disturbance (HCC) 11/25/2020  . Sepsis (HCC) 11/24/2020  . Acute metabolic encephalopathy 11/24/2020  . Depression 11/24/2020  . AF (paroxysmal atrial fibrillation) (HCC) 11/24/2020  . Hypothyroidism 10/22/2015  . Osteoarthritis 10/22/2015    Orientation RESPIRATION BLADDER Height & Weight     Self  Normal Incontinent Weight: 59 kg Height:  5\' 3"  (160 cm)  BEHAVIORAL SYMPTOMS/MOOD NEUROLOGICAL BOWEL NUTRITION STATUS      Incontinent Diet (Heart Healthy)  AMBULATORY STATUS COMMUNICATION OF NEEDS Skin   Independent Verbally Normal                       Personal Care Assistance Level of Assistance  Bathing,Dressing Bathing Assistance: Limited assistance   Dressing Assistance: Limited assistance     Functional Limitations Info  Sight,Hearing,Speech Sight Info: Adequate Hearing Info: Impaired (HOH) Speech Info: Adequate    SPECIAL CARE FACTORS FREQUENCY                       Contractures Contractures Info: Not present    Additional Factors Info  Code Status,Allergies (Full code) Code Status Info:  (Full code) Allergies Info:  (Beta Adrenergic Blockers)            Current Medications (11/26/2020):  This is the current hospital active medication list Current Facility-Administered Medications  Medication Dose Route Frequency Provider Last Rate Last Admin  . acetaminophen (TYLENOL) tablet 650 mg  650 mg Oral Q6H PRN 01/24/2021, MD   650 mg at 11/24/20 2024   Or  . acetaminophen (TYLENOL) suppository 650 mg  650 mg Rectal Q6H PRN Arrien, 2025, MD      . aspirin EC tablet 81 mg  81 mg Oral York Ram, MD   81 mg at 11/26/20 0553  . calcium-vitamin D (OSCAL WITH D) 500-200 MG-UNIT per tablet 1 tablet  1 tablet Oral Daily Arrien, 01/24/21, MD   1 tablet at 11/26/20 [phone]  . cefTRIAXone (ROCEPHIN) 1 g in sodium chloride 0.9 % 100 mL IVPB  1 g Intravenous Q24H 4034, MD 200 mL/hr at 11/26/20 0931 1 g at 11/26/20 0931  . celecoxib (CELEBREX) capsule 200 mg  200 mg Oral BID 01/24/21, MD   200 mg at 11/26/20 01/24/21  . cholecalciferol (VITAMIN D3) tablet 2,000 Units  2,000 Units Oral Daily 7425, MD   2,000 Units at 11/26/20 [phone]  . diltiazem (CARDIZEM) tablet 30 mg  30 mg Oral BID 9563, MD   30 mg at 11/26/20 [phone]  . divalproex (DEPAKOTE ER) 24 hr tablet 500 mg  500 mg Oral BH-q7a  Arrien, York Ram, MD   500 mg at 11/26/20 [phone]  . enoxaparin (LOVENOX) injection 40 mg  40 mg Subcutaneous Q24H Coralie Keens, MD   40 mg at 11/25/20 2303  . feeding supplement (ENSURE ENLIVE / ENSURE PLUS) liquid 237 mL  237 mL Oral BID BM Arrien, York Ram, MD   237 mL at 11/26/20 0936  . flecainide (TAMBOCOR) tablet 100 mg  100 mg Oral BID Coralie Keens, MD   100 mg at 11/26/20 3790  . fluticasone (FLONASE) 50 MCG/ACT nasal spray 1 spray  1 spray Each Nare Daily Arrien, York Ram, MD   1 spray at 11/26/20 [phone]  . levothyroxine (SYNTHROID) tablet 100 mcg  100 mcg Oral Q0600 Coralie Keens, MD   100 mcg at 11/26/20 0553  .  multivitamin (PROSIGHT) tablet 1 tablet  1 tablet Oral Daily Arrien, York Ram, MD   1 tablet at 11/26/20 [phone]  . multivitamin liquid 15 mL  15 mL Oral Daily Royce Macadamia, RPH   15 mL at 11/26/20 2992  . nortriptyline (PAMELOR) capsule 25 mg  25 mg Oral QHS Coralie Keens, MD   25 mg at 11/25/20 2302  . ondansetron (ZOFRAN) tablet 4 mg  4 mg Oral Q6H PRN Arrien, York Ram, MD       Or  . ondansetron [facility]) injection 4 mg  4 mg Intravenous Q6H PRN Arrien, York Ram, MD      . Melene Muller ON 11/27/2020] thiamine tablet 100 mg  100 mg Oral Daily Cindi Carbon, [facility]         Discharge Medications: Please see discharge summary for a list of discharge medications.  Relevant Imaging Results:  Relevant Lab Results:   Additional Information SS#[id number]  Necia Kamm, Olegario Messier, [place]

## 2020-11-27 LAB — URINE CULTURE: Culture: >=100000 — AB

## 2020-11-29 LAB — CULTURE, BLOOD (ROUTINE X 2)
Culture: NO GROWTH
Culture: NO GROWTH
Special Requests: ADEQUATE

## 2020-12-15 ENCOUNTER — Emergency Department (HOSPITAL_COMMUNITY): Payer: Medicare Other

## 2020-12-15 ENCOUNTER — Emergency Department (HOSPITAL_COMMUNITY)
Admission: EM | Admit: 2020-12-15 | Discharge: 2020-12-16 | Disposition: A | Discharge status: Home / Self Care | Payer: Medicare Other | Attending: Emergency Medicine | Admitting: Emergency Medicine

## 2020-12-15 ENCOUNTER — Encounter (HOSPITAL_COMMUNITY): Payer: Self-pay

## 2020-12-15 DIAGNOSIS — Y92191 Dining room in other specified residential institution as the place of occurrence of the external cause: Secondary | ICD-10-CM | POA: Insufficient documentation

## 2020-12-15 DIAGNOSIS — S01111A Laceration without foreign body of right eyelid and periocular area, initial encounter: Secondary | ICD-10-CM | POA: Insufficient documentation

## 2020-12-15 DIAGNOSIS — W01198A Fall on same level from slipping, tripping and stumbling with subsequent striking against other object, initial encounter: Secondary | ICD-10-CM | POA: Insufficient documentation

## 2020-12-15 DIAGNOSIS — Z23 Encounter for immunization: Secondary | ICD-10-CM | POA: Insufficient documentation

## 2020-12-15 DIAGNOSIS — W19XXXA Unspecified fall, initial encounter: Secondary | ICD-10-CM

## 2020-12-15 DIAGNOSIS — E039 Hypothyroidism, unspecified: Secondary | ICD-10-CM | POA: Insufficient documentation

## 2020-12-15 DIAGNOSIS — F0390 Unspecified dementia without behavioral disturbance: Secondary | ICD-10-CM | POA: Insufficient documentation

## 2020-12-15 DIAGNOSIS — Z79899 Other long term (current) drug therapy: Secondary | ICD-10-CM | POA: Diagnosis not present

## 2020-12-15 DIAGNOSIS — Z7982 Long term (current) use of aspirin: Secondary | ICD-10-CM | POA: Insufficient documentation

## 2020-12-15 DIAGNOSIS — I480 Paroxysmal atrial fibrillation: Secondary | ICD-10-CM | POA: Insufficient documentation

## 2020-12-15 DIAGNOSIS — S0993XA Unspecified injury of face, initial encounter: Secondary | ICD-10-CM | POA: Diagnosis present

## 2020-12-15 DIAGNOSIS — S0083XA Contusion of other part of head, initial encounter: Secondary | ICD-10-CM

## 2020-12-15 DIAGNOSIS — S0181XA Laceration without foreign body of other part of head, initial encounter: Secondary | ICD-10-CM

## 2020-12-15 LAB — COMPREHENSIVE METABOLIC PANEL
ALT: 18 U/L (ref 0–44)
AST: 21 U/L (ref 15–41)
Albumin: 3.7 g/dL (ref 3.5–5.0)
Alkaline Phosphatase: 137 U/L — ABNORMAL HIGH (ref 38–126)
Anion gap: 10 (ref 5–15)
BUN: 17 mg/dL (ref 8–23)
CO2: 27 mmol/L (ref 22–32)
Calcium: 9.1 mg/dL (ref 8.9–10.3)
Chloride: 103 mmol/L (ref 98–111)
Creatinine, Ser: 0.91 mg/dL (ref 0.44–1.00)
GFR, Estimated: >60 mL/min (ref >60)
Glucose, Bld: 123 mg/dL — ABNORMAL HIGH (ref 70–99)
Potassium: 3.8 mmol/L (ref 3.5–5.1)
Sodium: 140 mmol/L (ref 135–145)
Total Bilirubin: 0.7 mg/dL (ref 0.3–1.2)
Total Protein: 6.9 g/dL (ref 6.5–8.1)

## 2020-12-15 LAB — CBC WITH DIFFERENTIAL/PLATELET
Abs Immature Granulocytes: 0.03 10*3/uL (ref 0.00–0.07)
Basophils Absolute: 0 10*3/uL (ref 0.0–0.1)
Basophils Relative: 0 %
Eosinophils Absolute: 0.1 10*3/uL (ref 0.0–0.5)
Eosinophils Relative: 1 %
HCT: 35.3 % — ABNORMAL LOW (ref 36.0–46.0)
Hemoglobin: 11.4 g/dL — ABNORMAL LOW (ref 12.0–15.0)
Immature Granulocytes: 0 %
Lymphocytes Relative: 27 %
Lymphs Abs: 3.1 10*3/uL (ref 0.7–4.0)
MCH: 30 pg (ref 26.0–34.0)
MCHC: 32.3 g/dL (ref 30.0–36.0)
MCV: 92.9 fL (ref 80.0–100.0)
Monocytes Absolute: 0.7 10*3/uL (ref 0.1–1.0)
Monocytes Relative: 6 %
Neutro Abs: 7.4 10*3/uL (ref 1.7–7.7)
Neutrophils Relative %: 66 %
Platelets: 343 10*3/uL (ref 150–400)
RBC: 3.8 MIL/uL — ABNORMAL LOW (ref 3.87–5.11)
RDW: 13.2 % (ref 11.5–15.5)
WBC: 11.3 10*3/uL — ABNORMAL HIGH (ref 4.0–10.5)
nRBC: 0 % (ref 0.0–0.2)

## 2020-12-15 MED ORDER — LIDOCAINE-EPINEPHRINE (PF) 2 %-1:200000 IJ SOLN
20.0000 mL | Freq: Once | INTRAMUSCULAR | Status: AC
  Administered 2020-12-15: 20 mL
  Filled 2020-12-15: qty 20

## 2020-12-15 MED ORDER — TETANUS-DIPHTH-ACELL PERTUSSIS 5-2.5-18.5 LF-MCG/0.5 IM SUSY
0.5000 mL | PREFILLED_SYRINGE | Freq: Once | INTRAMUSCULAR | Status: AC
  Administered 2020-12-15: 0.5 mL via INTRAMUSCULAR
  Filled 2020-12-15: qty 0.5

## 2020-12-15 NOTE — ED Triage Notes (Signed)
Pt BIB EMS  Per EMS witnessed fall face first into the ground, laceration to right eyebrow, swelling to right eye, no LOC, no pain.  Dementia per baseline, A&O to self and situation. Ambulatory without devices.   Vitals 128/86 Pulse 88 R 16 O2 98 room air

## 2020-12-15 NOTE — ED Notes (Signed)
Spoke with Jamelle Haring RN @ [facility] to give report as patient is being discharged and transported back to facility

## 2020-12-15 NOTE — ED Notes (Signed)
[facility] contacted for transport back to facility- [facility] at [place]

## 2020-12-15 NOTE — Discharge Instructions (Addendum)
Patient was brought to the hospital due to suffering a fall.  CT scan of her head, neck, and face showed no acute fractures.  X-ray of her left knee showed no acute fractures or dislocations.  Chest x-ray showed no signs of infection.    Received 2 sutures to a laceration above her right eyebrow.  Patient needs to have the sutures removed in 5 to 7 days.  Patient had her tetanus booster shot updated.  Please return to the emergency department if you have any new or concerning symptoms

## 2020-12-15 NOTE — ED Provider Notes (Signed)
[facility]-EMERGENCY DEPT Provider Note   CSN: [id number] Arrival date & time: 12/15/20  1940     History Chief Complaint  Patient presents with  . Fall  . Facial Injury    Marie Maldonado is a 80 y.o. female a history of dementia, atrial fibrillation (on flecainide, no blood thinner), osteoarthritis, hypothyroidism.  Patient has a history of dementia is alert to person only.  Patient is unable to give any information on the events of her fall.  Patient denies any pain or discomfort at this time.  Called [facility] and spoke with staff member Micheline who witnessed the fall.  She reports that patient was standing in the dining area when her foot got stuck in a chair and she fell forward hitting her face on the floor.  No loss of consciousness.  Patient is oriented to self at baseline.  Patient has had no recent change in her mental status.  HPI     Past Medical History:  Diagnosis Date  . Atrial fibrillation (HCC)   . Hypothyroidism 10/22/2015  . Osteoarthritis 10/22/2015    Patient Active Problem List   Diagnosis Date Noted  . Acute lower UTI 11/25/2020  . Dementia without behavioral disturbance (HCC) 11/25/2020  . Sepsis (HCC) 11/24/2020  . Acute metabolic encephalopathy 11/24/2020  . Depression 11/24/2020  . AF (paroxysmal atrial fibrillation) (HCC) 11/24/2020  . Hypothyroidism 10/22/2015  . Osteoarthritis 10/22/2015    Past Surgical History:  Procedure Laterality Date  . ABDOMINAL HYSTERECTOMY    . APPENDECTOMY    . CHOLECYSTECTOMY    . PANCREAS SURGERY       OB History   No obstetric history on file.     History reviewed. No pertinent family history.  Social History   Tobacco Use  . Smoking status: Never Smoker  . Smokeless tobacco: Never Used    Home Medications Prior to Admission medications   Medication Sig Start Date End Date Taking? Authorizing Provider  acetaminophen (TYLENOL) 500 MG tablet Take  500 mg by mouth every 6 (six) hours as needed (pain).    [provider]  aspirin EC 81 MG tablet Take 81 mg by mouth every morning.     [provider]  beta carotene w/minerals (OCUVITE) tablet Take 1 tablet by mouth daily.    [provider]  calcium carbonate (TUMS SMOOTHIES) 750 MG chewable tablet Chew 2 tablets by mouth as needed for heartburn. 03/22/19   [provider]  Cholecalciferol (VITAMIN D3) 2000 units capsule Take 2,000 Units by mouth daily at 12 noon. Patient not taking: Reported on 11/24/2020    [provider]  flecainide (TAMBOCOR) 100 MG tablet Take 100 mg by mouth 2 (two) times daily. May take an additional 100 mg for arrhythmias    [provider]  levothyroxine (SYNTHROID) 112 MCG tablet Take 112 mcg by mouth daily before breakfast.    [provider]  Melatonin 3 MG CAPS Take 3 mg by mouth at bedtime. 11/16/20   [provider]  Multiple Vitamins-Minerals (MULTIVITAMIN ADULT) CHEW Chew 1 tablet by mouth daily.    [provider]  nystatin (MYCOSTATIN/NYSTOP) powder Apply 1 application topically as needed (infection under breast). 10/31/20   [provider]  phenol (CHLORASEPTIC) 1.4 % LIQD Use as directed 2 sprays in the mouth or throat as needed for throat irritation / pain (sore throat). 10/18/20   [provider]  polyethylene glycol powder (GLYCOLAX/MIRALAX) 17 GM/SCOOP  powder Take 17 g by mouth as needed for mild constipation. 05/31/19   [provider]    Allergies    Beta adrenergic blockers  Review of Systems   Review of Systems  Unable to perform ROS: Dementia    Physical Exam Updated Vital Signs BP (!) 166/77 (BP Location: Left Arm)   Pulse 84   Temp 98 F (36.7 C) (Oral)   Resp 18   SpO2 98%   Physical Exam Vitals and nursing note reviewed.  Constitutional:      General: She is not in acute distress.    Appearance: She is not ill-appearing,  toxic-appearing or diaphoretic.     Interventions: Cervical collar in place.  HENT:     Head: Normocephalic. Raccoon eyes, contusion and laceration present. No abrasion or masses.     Jaw: No trismus or pain on movement.     Comments: Patient has swelling and bruising in under her right eye 1 cm laceration noted above right eyebrow Contusion to midline frontal  Eyes:     General: Vision grossly intact. No scleral icterus.       Right eye: No discharge.        Left eye: No discharge.     Extraocular Movements: Extraocular movements intact.     Pupils: Pupils are equal, round, and reactive to light.  Cardiovascular:     Rate and Rhythm: Normal rate.  Pulmonary:     Effort: Pulmonary effort is normal.  Abdominal:     General: Abdomen is flat. There is no distension. There are no signs of injury.     Palpations: Abdomen is soft. There is no mass or pulsatile mass.     Tenderness: There is no abdominal tenderness. There is no guarding or rebound.     Hernia: There is no hernia in the umbilical area or ventral area.  Musculoskeletal:     Cervical back: Normal range of motion and neck supple. No swelling, edema, deformity, erythema, signs of trauma, lacerations, rigidity, spasms, torticollis, tenderness or bony tenderness. No pain with movement. Normal range of motion.     Thoracic back: No swelling, edema, deformity, tenderness or bony tenderness.     Lumbar back: No swelling, edema, deformity, tenderness or bony tenderness.     Right lower leg: No edema.     Left lower leg: No edema.     Comments: Tenderness to palpation of left patella, minimal bruising noted, no swelling or effusion  No tenderness to upper left or right extremities; able to move extremities without difficulty; no pain on passive range of motion  No tenderness to right right lower extremity; patient able to lift both legs without difficulty; no pain on passive range of motion of bilateral lower extremities  Skin:     General: Skin is warm and dry.  Neurological:     General: No focal deficit present.     Mental Status: She is alert.     GCS: GCS eye subscore is 4. GCS verbal subscore is 5. GCS motor subscore is 6.     Cranial Nerves: No cranial nerve deficit or facial asymmetry.     Sensory: Sensation is intact.     Motor: No weakness, tremor, seizure activity or pronator drift.     Coordination: Finger-Nose-Finger Test normal.     Comments: CN II-XII intact; performed in supine position, +5 strength to bilateral upper extremities, +5 strength to dorsiflexion and plantarflexion, patient able to left both legs against gravity and  hold each there without difficulty   Psychiatric:        Behavior: Behavior is cooperative.        ED Results / Procedures / Treatments   Labs (all labs ordered are listed, but only abnormal results are displayed) Labs Reviewed  COMPREHENSIVE METABOLIC PANEL  CBC WITH DIFFERENTIAL/PLATELET    EKG None  Radiology DG Chest Portable 1 View  Result Date: 12/15/2020 CLINICAL DATA:  Fall EXAM: PORTABLE CHEST 1 VIEW COMPARISON:  11/24/2020 FINDINGS: The heart size and mediastinal contours are within normal limits. Aortic atherosclerosis. Both lungs are clear. The visualized skeletal structures are unremarkable. IMPRESSION: No active disease. Electronically Signed   By: Jasmine Pang M.D.   On: 12/15/2020 20:44    Procedures .Marland KitchenLaceration Repair  Date/Time: 12/15/2020 10:22 PM Performed by: Haskel Schroeder, PA-C Authorized by: Haskel Schroeder, PA-C   Consent:    Consent obtained:  Emergent situation Universal protocol:    Patient identity confirmed:  Arm band and verbally with patient Anesthesia:    Anesthesia method:  Local infiltration   Local anesthetic:  Lidocaine 2% WITH epi Laceration details:    Location:  Face   Face location:  R eyebrow   Length (cm):  1   Depth (mm):  3 Pre-procedure details:    Preparation:  Patient was prepped and draped  in usual sterile fashion Exploration:    Hemostasis achieved with:  Direct pressure   Imaging outcome: foreign body not noted     Wound exploration: entire depth of wound visualized     Contaminated: no   Treatment:    Area cleansed with:  Povidone-iodine   Amount of cleaning:  Standard Skin repair:    Repair method:  Sutures   Suture size:  6-0   Suture material:  Prolene   Suture technique:  Simple interrupted   Number of sutures:  2 Approximation:    Approximation:  Close Repair type:    Repair type:  Simple Post-procedure details:    Dressing:  Open (no dressing)   Procedure completion:  Tolerated well, no immediate complications    Medications Ordered in ED Medications - No data to display  ED Course  I have reviewed the triage vital signs and the nursing notes.  Pertinent labs & imaging results that were available during my care of the patient were reviewed by me and considered in my medical decision making (see chart for details).    MDM Rules/Calculators/A&P                          Alert 80 year old female no acute distress,, nontoxic-appearing.  Patient has a history of dementia, alert to self at baseline.  Level 5 caveat due to her dementia.  Staff member at [facility] which that patient was standing, got her foot stuck in a chair, and fell face forward.  No loss of consciousness, patient alert to her baseline.  Per chart review patient is not on any blood thinners.    Patient was immobilized via C-collar upon arrival.  Patient noted to have swelling and bruising under right eye.  Contusion to forehead.  No step-off, deformity, midline tenderness to cervical, thoracic or lumbar back.  Patient has tenderness to left knee, bruising noted, no effusion, swelling, decreased range of motion.  No pain with passive range of motion to bilateral upper and lower extremities.  No tenderness to palpation of bilateral upper remedies and lower right extremity.  No tenderness  to ribs or  chest wall.  Due to patient's mechanism of injury will obtain noncontrast CT of head, neck, and maxillofacial.  Will also obtain chest x-ray and right knee x-ray.  X-ray showed no acute cardiopulmonary disease.   CT head showed no acute intracranial abnormality.  X-ray knee showed no acute fracture or dislocation.  CT cervical neck showed no evidence of acute fracture within the cervical spine.  CT maxillofacial shows moderate severity right-sided facial and right periorbital soft tissue swelling with 1.7 cm x 0.9 cm right facial soft tissue hematoma.  Mild right frontal scalp soft tissue swelling.  No acute osseous abnormality.  Could not verify the patient's last Tdap with her SNF facility or with records.  Will update patient's tetanus shot.  Suture surgery was performed to patient's right eyebrow laceration.  Patient was discussed with and evaluated by Dr. Stevie Kern.     Final Clinical Impression(s) / ED Diagnoses Final diagnoses:  Contusion of face, initial encounter  Fall, initial encounter  Facial laceration, initial encounter    Rx / DC Orders ED Discharge Orders    None       Berneice Heinrich 12/15/20 2235    Milagros Loll, MD 12/16/20 [phone]

## 2020-12-16 NOTE — ED Notes (Signed)
Spoke with non-emergency line regarding transport back to facility. Patient is 2nd in line for transport.

## 2020-12-16 NOTE — ED Notes (Signed)
Pt assisted to [facility], brief and linen changed.

## 2022-05-20 IMAGING — DX DG PORTABLE PELVIS
1 series · 1 of 1 positions shown · non-contrast
Comparison: None.

CLINICAL DATA: Unwitnessed fall, altered mental status

EXAM:
PORTABLE PELVIS 1-2 VIEWS

[chest ap]
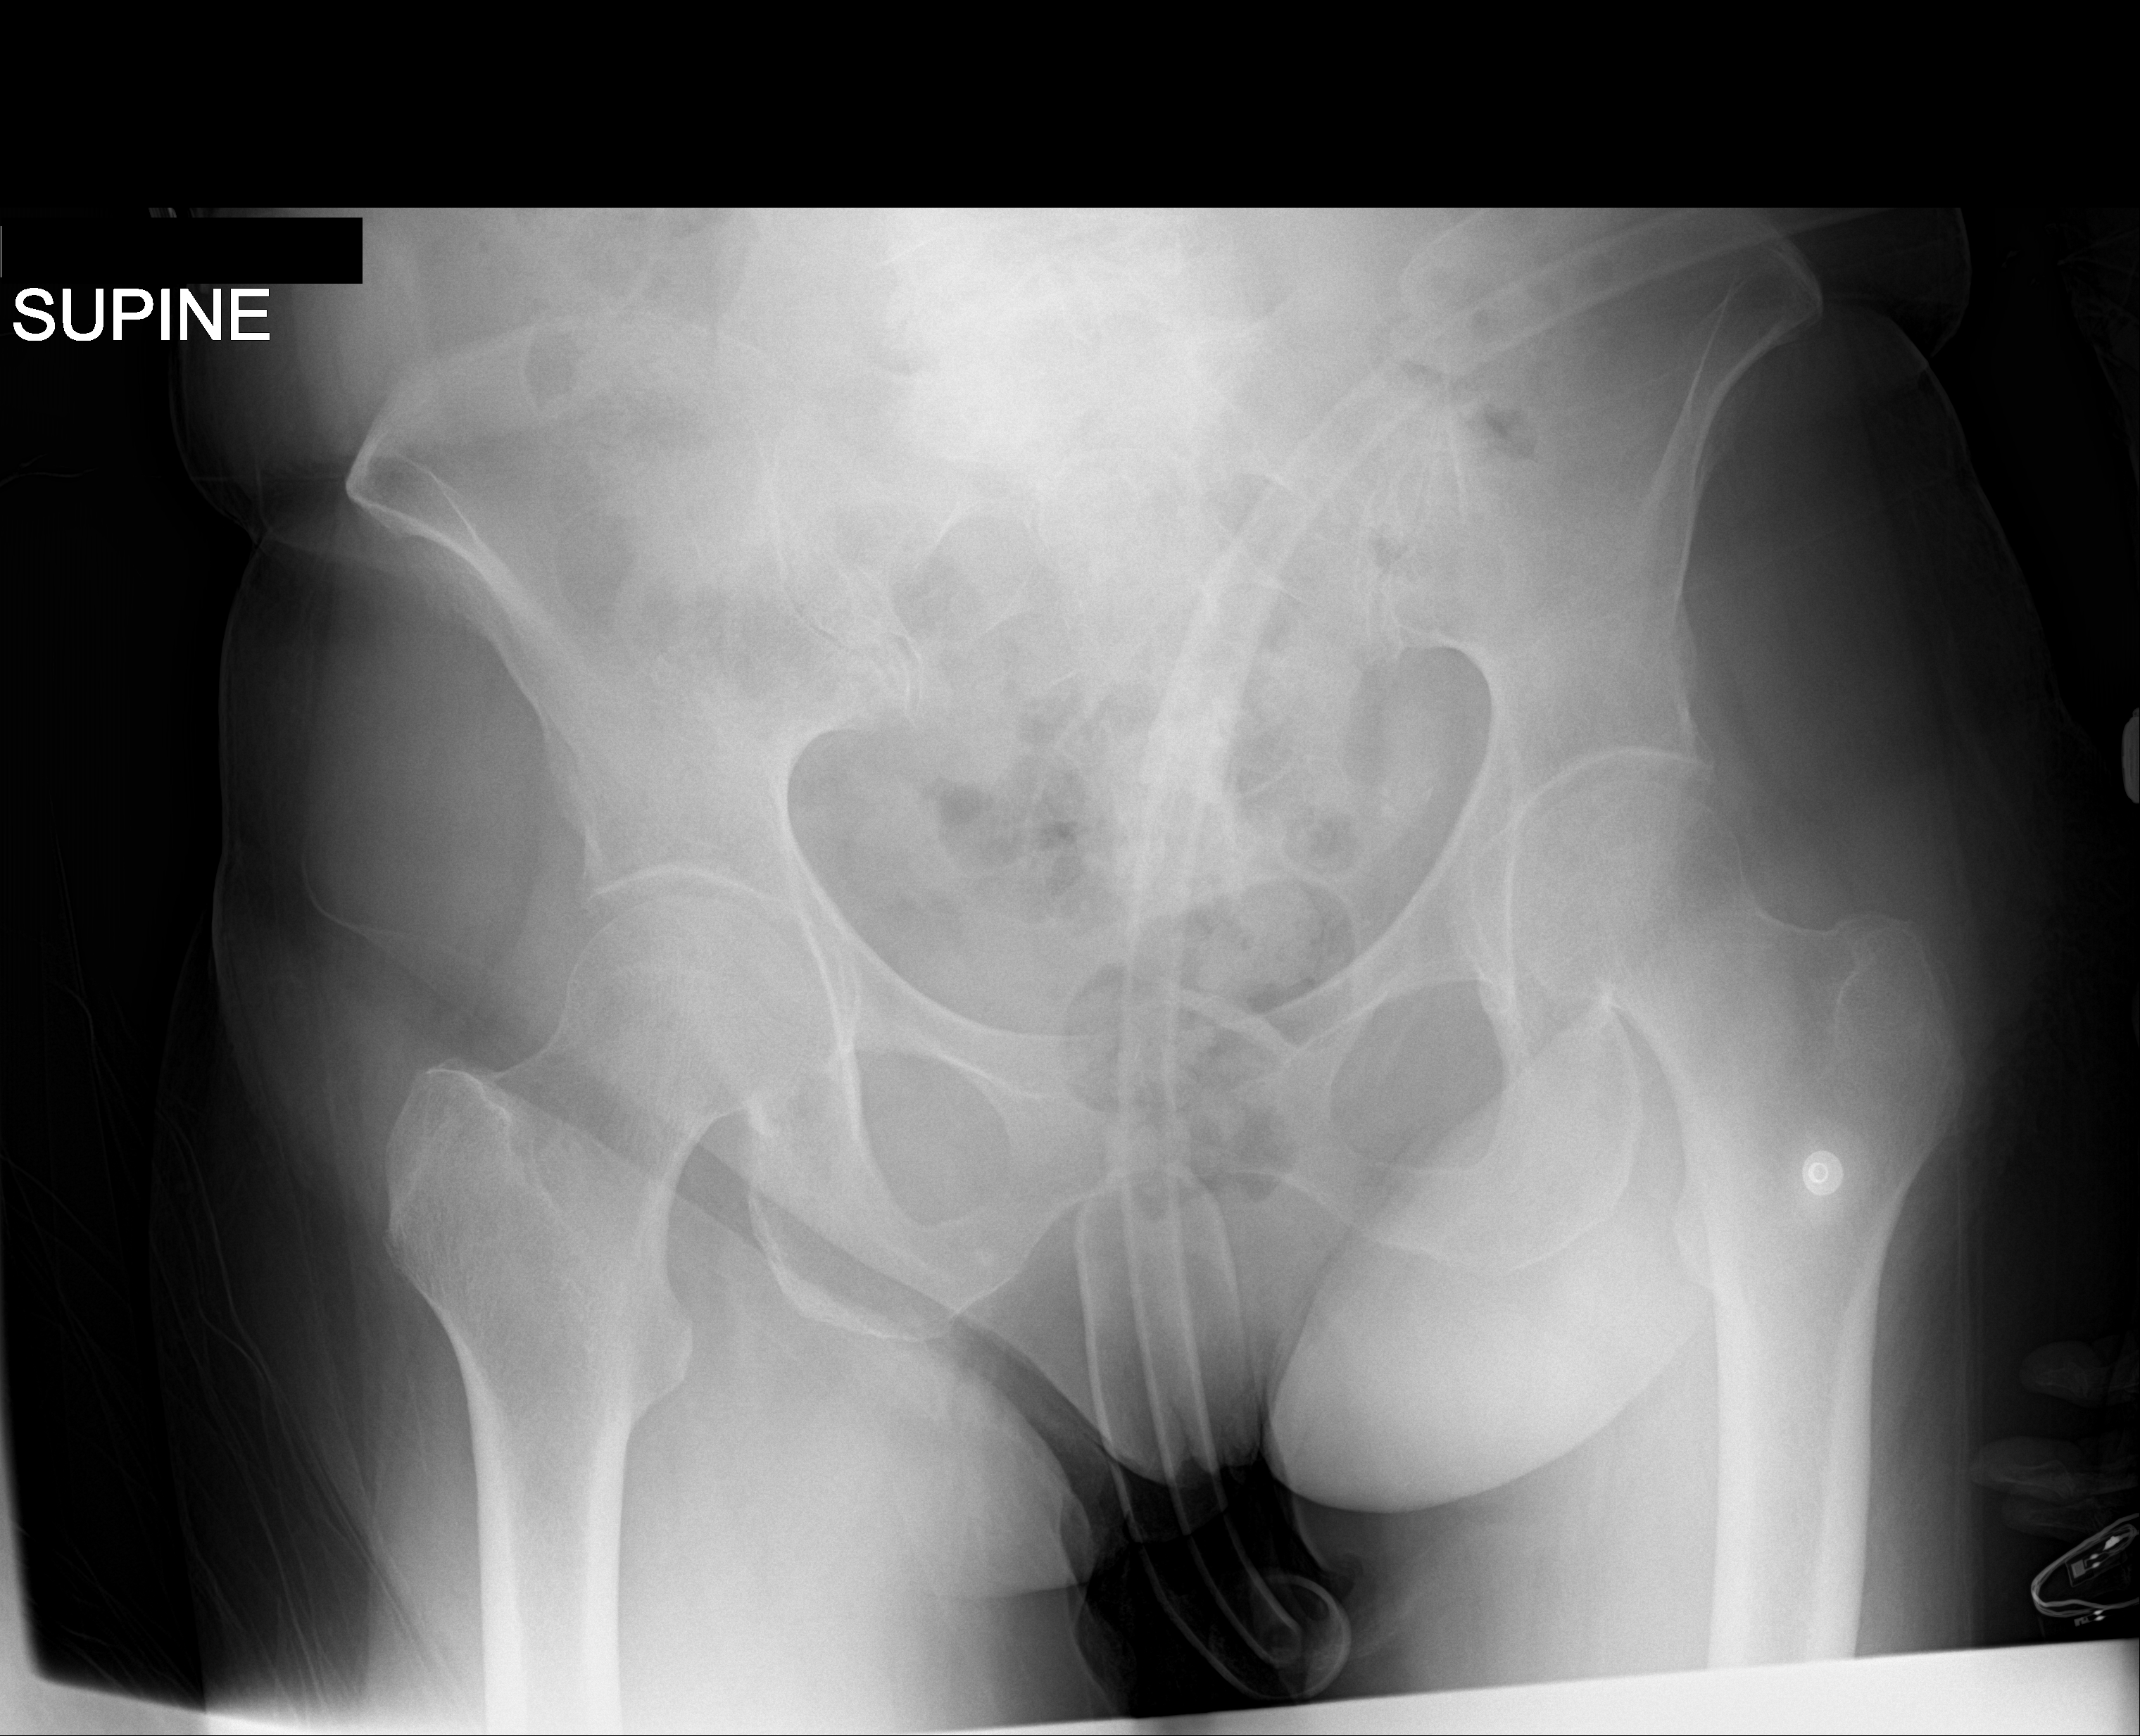

[1 of 1 positions shown; findings below may reference images not displayed]

FINDINGS: No pelvic fracture or diastasis. Degenerative changes in the
visualized lower lumbar spine. No evidence of hip dislocation on
this single frontal view. No significant hip arthropathy. No
suspicious focal osseous lesions.
IMPRESSION: No pelvic fracture.

## 2022-10-24 DEATH — deceased
